# Patient Record
Sex: Female | Born: 1970 | Hispanic: Yes
Health system: Midwestern US, Community
[De-identification: ages and names within clinical notes are randomized; demographics above are authoritative.]

## PROBLEM LIST (undated history)

## (undated) DIAGNOSIS — I10 Essential (primary) hypertension: Secondary | ICD-10-CM

## (undated) DIAGNOSIS — E785 Hyperlipidemia, unspecified: Secondary | ICD-10-CM

## (undated) HISTORY — PX: CHOLECYSTECTOMY: SHX55

## (undated) HISTORY — PX: TUBAL LIGATION: SHX77

## (undated) HISTORY — DX: Hyperlipidemia, unspecified: E78.5

## (undated) HISTORY — DX: Essential (primary) hypertension: I10

## (undated) HISTORY — PX: APPENDECTOMY: SHX54

---

## 2017-08-30 ENCOUNTER — Inpatient Hospital Stay
Admit: 2017-08-30 | Discharge: 2017-08-30 | Disposition: A | Discharge status: Home / Self Care | Payer: MEDICAID | Attending: Emergency Medicine

## 2017-08-30 DIAGNOSIS — R112 Nausea with vomiting, unspecified: Secondary | ICD-10-CM

## 2017-08-30 LAB — EKG 12-LEAD
Atrial Rate: 66 {beats}/min
P Axis: 68 degrees
P-R Interval: 132 ms
Q-T Interval: 400 ms
QRS Duration: 88 ms
QTc Calculation (Bazett): 419 ms
R Axis: 19 degrees
T Axis: 20 degrees
Ventricular Rate: 66 {beats}/min

## 2017-08-30 LAB — URINE WITH REFLEXED MICRO
CASTS 2: NONE SEEN /lpf
Casts UA: NONE SEEN /lpf
Crystals, UA: NONE SEEN
Glucose, Ur: NEGATIVE mg/dl
Leukocyte Esterase, Urine: NEGATIVE
MISCELLANEOUS 2: NONE SEEN
Nitrite, Urine: NEGATIVE
Protein, UA: NEGATIVE
Renal Epithelial, UA: NONE SEEN
Specific Gravity, Urine: 1.019 (ref 1.002–1.03)
Urobilinogen, Urine: 1 eu/dl (ref 0.0–1.0)
Yeast, UA: NONE SEEN
pH, UA: 5.5 (ref 5.0–9.0)

## 2017-08-30 LAB — OSMOLALITY: Osmolality Calc: 274.6 mOsmol/kg — ABNORMAL LOW (ref >=275.0)

## 2017-08-30 LAB — BASIC METABOLIC PANEL
BUN: 10 mg/dL (ref 7–22)
CO2: 26 meq/L (ref 23–33)
Calcium: 10 mg/dL (ref 8.5–10.5)
Chloride: 97 meq/L — ABNORMAL LOW (ref 98–111)
Creatinine: 0.6 mg/dL (ref 0.4–1.2)
Glucose: 97 mg/dL (ref 70–108)
Potassium: 4 meq/L (ref 3.5–5.2)
Sodium: 138 meq/L (ref 135–145)

## 2017-08-30 LAB — HCG, SERUM, QUALITATIVE: Preg, Serum: NEGATIVE

## 2017-08-30 LAB — BILE ACIDS, TOTAL: Ictotest: NEGATIVE

## 2017-08-30 LAB — ANION GAP: Anion Gap: 15 meq/L (ref 8.0–16.0)

## 2017-08-30 LAB — GLOMERULAR FILTRATION RATE, ESTIMATED: Est, Glom Filt Rate: >90 mL/min/{1.73_m2}

## 2017-08-30 MED ORDER — ONDANSETRON HCL 4 MG/2ML IJ SOLN
4 MG/2ML | Freq: Once | INTRAMUSCULAR | Status: AC
Start: 2017-08-30 — End: 2017-08-30
  Administered 2017-08-30: 15:00:00 4 mg via INTRAVENOUS

## 2017-08-30 MED ORDER — SODIUM CHLORIDE 0.9 % IV BOLUS
0.9 % | Freq: Once | INTRAVENOUS | Status: AC
Start: 2017-08-30 — End: 2017-08-30
  Administered 2017-08-30: 15:00:00 1000 mL via INTRAVENOUS

## 2017-08-30 MED FILL — ONDANSETRON HCL 4 MG/2ML IJ SOLN: 4 MG/2ML | INTRAMUSCULAR | Qty: 2

## 2017-08-30 NOTE — ED Triage Notes (Signed)
Patient presents to ED with c/o vomiting that started yesterday around 0500. Patient states that she hasn't been able to keep any fluids down. States that before when this has happened it was due to her blood pressure being elevated. Denies any chest pain or headache and states that her last dose of lisinopril was yesterday morning. Call light in reach.

## 2017-08-30 NOTE — ED Provider Notes (Signed)
[facility]  eMERGENCY dEPARTMENT eNCOUnter      Pt Name: Melissa Bender  MRN: [id number]  Birthdate 1970-11-26  Date of evaluation: 08/30/2017  Provider: Neva SeatALEXANDER A Asherah Lavoy, DO    CHIEF COMPLAINT       Chief Complaint   Patient presents with   . Emesis   . Hypertension       HISTORY OF PRESENT ILLNESS    Melissa Icelizabath Picotte is a 46 y.o. female who presents to the emergency department from home With complaints of multiple episodes of vomiting since 5:00 yesterday morning.  Patient states she's had approximately 20 episodes of vomiting but no diarrhea, no fever, no chills, no abdominal pain.  She has had a very mild headache.  Denies any dysuria or hematuria.  Denies any other associated symptoms.  She called a family doctor today but was told to come to the emergency department to get "checked out".  Negative recent travel history and no infectious contacts      Triage notes and Nursing notes were reviewed by myself.  Any discrepancies are addressed above.    PAST MEDICAL HISTORY     Past Medical History:   Diagnosis Date   . Hypertension        SURGICAL HISTORY       Past Surgical History:   Procedure Laterality Date   . CHOLECYSTECTOMY     . TUBAL LIGATION         CURRENT MEDICATIONS       Discharge Medication List as of 08/30/2017 11:21 AM      CONTINUE these medications which have NOT CHANGED    Details   lisinopril (PRINIVIL;ZESTRIL) 10 MG tablet Take 10 mg by mouth dailyHistorical Med             ALLERGIES     Patient has no known allergies.    FAMILY HISTORY     History reviewed. No pertinent family history.     SOCIAL HISTORY       Social History     Social History   . Marital status: Divorced     Spouse name: N/A   . Number of children: N/A   . Years of education: N/A     Social History Main Topics   . Smoking status: Never Smoker   . Smokeless tobacco: None   . Alcohol use No   . Drug use: Unknown   . Sexual activity: Not Asked     Other Topics Concern   . None     Social History Narrative    . None       REVIEW OF SYSTEMS     Review of Systems   Constitutional: Negative for chills, diaphoresis and fever.   HENT: Negative for facial swelling, sore throat, trouble swallowing and voice change.    Eyes: Negative for photophobia and visual disturbance.   Respiratory: Negative for cough, chest tightness, shortness of breath and wheezing.    Cardiovascular: Negative for chest pain, palpitations and leg swelling.   Gastrointestinal: Positive for nausea and vomiting. Negative for abdominal pain, blood in stool and diarrhea.   Endocrine: Negative for polydipsia and polyuria.   Genitourinary: Negative for dysuria, frequency, hematuria and urgency.   Musculoskeletal: Negative for back pain, neck pain and neck stiffness.   Skin: Negative for pallor and rash.   Neurological: Negative for seizures, speech difficulty, weakness, numbness and headaches.   Hematological: Does not bruise/bleed easily.   Psychiatric/Behavioral: Negative for confusion. The patient  is not nervous/anxious.        Except as noted above the remainder of the review of systems was reviewed and isnegative.   PHYSICAL EXAM    (up to 7 for level 4, 8 or more for level 5)     ED Triage Vitals [08/30/17 0948]   BP Temp Temp Source Pulse Resp SpO2 Height Weight   111/64 98.8 F (37.1 C) Oral 66 10 100 % 4\' 11"  (1.499 m) 200 lb (90.7 kg)       Physical Exam   Constitutional: She is oriented to person, place, and time. Vital signs are normal. She appears well-developed and well-nourished.  Non-toxic appearance. She does not appear ill. No distress.   HENT:   Head: Normocephalic and atraumatic.   Mouth/Throat: Oropharynx is clear and moist.   Eyes: Pupils are equal, round, and reactive to light. Conjunctivae and EOM are normal. Right conjunctiva is not injected. Left conjunctiva is not injected. No scleral icterus.   Neck: Normal range of motion. Neck supple. No tracheal deviation present. No thyromegaly present.   Cardiovascular: Normal rate, regular  rhythm, normal heart sounds and intact distal pulses.  Exam reveals no gallop and no friction rub.    No murmur heard.  Pulmonary/Chest: Effort normal and breath sounds normal. No stridor. No respiratory distress. She has no wheezes. She has no rales.   Abdominal: Soft. Bowel sounds are normal. She exhibits no distension and no mass. There is no tenderness. There is no rebound and no guarding.   Negative Murphy's sign  Nontender McBurney's Point  Negative Rovsig's sign  No bruising or echymosis of abdomen   Musculoskeletal: She exhibits no edema or tenderness.   Negative Homan's Sign bilaterally   Lymphadenopathy:     She has no cervical adenopathy.   Neurological: She is alert and oriented to person, place, and time. No cranial nerve deficit. She exhibits normal muscle tone. Coordination normal.   No nystagmus   Skin: Skin is warm and dry. No rash noted. She is not diaphoretic. No erythema. No pallor.   Psychiatric: She has a normal mood and affect. Her behavior is normal. Thought content normal.   Nursing note and vitals reviewed.      DIAGNOSTIC RESULTS     EKG:(none if blank)  All EKG's are interpreted by theEmergency Department Physician who either signs or Co-signs this chart in the absence of a cardiologist.    EKG done per nursing protocol is reviewed by myself.  Shows a sinus rhythm with a sinus arrhythmia, normal PR, QRS, and QTc intervals.  No ectopy.  There is isolated T-wave inversion in lead 3.    RADIOLOGY: (none if blank)   Interpretation per the Radiologistbelow, if available at the time of this note:    @WETREAD @    LABS:  @EDLABS @    All other labs were within normal range or not returned as of this dictation.    EMERGENCY DEPARTMENT COURSE andMedical Decision Making:     MDM/   The patient presents with vomiting.  The patient is feeling better with a benign repeat examination. There is no evidence of an acute abdomen at this time.  Based on history, physical exam, risk factors, and tests,  my  suspicion for bowel obstruction, acute pancreatitis, abscess, perforated viscous, diverticulitis, cholecystis, appendicitis is very low and I feel the patient can be managed as an outpatient with follow up.  I also see no evidence of aortic dissection, AAA, or Acute Coronary  Syndrome.  Instructions have been given for the patient to return to the ED for worsening of the pain, high fevers, intractable vomiting, or bleeding.  Strict follow up and return precautions have been discussed.    Strict returnprecautions and follow up instructions were discussed with the patient with which the patient agrees    ED Medications administered this visit:    Medications   ondansetron (ZOFRAN) injection 4 mg (4 mg Intravenous Given 08/30/17 1008)   0.9 % sodium chloride bolus (0 mLs Intravenous Stopped 08/30/17 1147)       CONSULTS: (None if blank)  None    Procedures: (None if blank)       CLINICAL IMPRESSION      1. Intractable vomiting with nausea, unspecified vomiting type          DISPOSITION/PLAN   DISPOSITION Decision To Discharge 08/30/2017 11:47:58 AM      PATIENT REFERRED TO:  [facility]  [address] [place] [address]  [phone]  Go in 2 days        DISCHARGE MEDICATIONS:  Discharge Medication List as of 08/30/2017 11:21 AM                 (Please note that portions of this note were completed with a voice recognition program.  Efforts were made to edit the dictations but occasionallywords are mis-transcribed.)      Neva Seat, DO (electronically signed)  Attending Emergency Department Provider          Neva Seat, DO  09/03/17 [phone]

## 2017-08-30 NOTE — Discharge Instructions (Signed)
Return to the Emergency Department immediately if you develop any abdominal pain, fever, persistent vomiting, chest pain, lightheadedness, dizziness , or you have any other concerns.  Please follow up with your primary care doctor in 1-2 days.

## 2017-08-30 NOTE — ED Notes (Signed)
Patient resting in bed drinking gatorade. Denies any nausea at this time. Updated on plan of care. IV fluids still infusing. Denies any other needs. Call light in reach.      Wynelle LinkKristen Alantis Bethune, RN  08/30/17 1116

## 2020-01-22 LAB — HEMOGLOBIN A1C: Hemoglobin A1C: 5.3

## 2020-01-22 LAB — LIPID PANEL
Cholesterol: 172 (ref 0–200)
HDL: 41 (ref 35–70)
LDL Cholesterol: 103
LDl/HDL Ratio: 28
Triglycerides: 161 — AB (ref 40–160)

## 2020-01-22 LAB — RESULTS CONSOLE HPV: CHL HPV: POSITIVE

## 2020-03-10 HISTORY — PX: LEEP: SHX91

## 2020-09-27 ENCOUNTER — Emergency Department (HOSPITAL_COMMUNITY)
Admission: EM | Admit: 2020-09-27 | Discharge: 2020-09-28 | Disposition: A | Discharge status: Home / Self Care | Payer: Self-pay | Attending: Emergency Medicine | Admitting: Emergency Medicine

## 2020-09-27 DIAGNOSIS — I10 Essential (primary) hypertension: Secondary | ICD-10-CM | POA: Insufficient documentation

## 2020-09-27 DIAGNOSIS — N390 Urinary tract infection, site not specified: Secondary | ICD-10-CM | POA: Insufficient documentation

## 2020-09-27 LAB — URINALYSIS, ROUTINE W REFLEX MICROSCOPIC
Bilirubin Urine: NEGATIVE
Glucose, UA: NEGATIVE mg/dL
Hgb urine dipstick: NEGATIVE
Ketones, ur: NEGATIVE mg/dL
Nitrite: NEGATIVE
Protein, ur: NEGATIVE mg/dL
Specific Gravity, Urine: 1.015 (ref 1.005–1.030)
pH: 5 (ref 5.0–8.0)

## 2020-09-27 LAB — COMPREHENSIVE METABOLIC PANEL
ALT: 24 U/L (ref 0–44)
AST: 22 U/L (ref 15–41)
Albumin: 3.9 g/dL (ref 3.5–5.0)
Alkaline Phosphatase: 77 U/L (ref 38–126)
Anion gap: 11 (ref 5–15)
BUN: 9 mg/dL (ref 6–20)
CO2: 26 mmol/L (ref 22–32)
Calcium: 9.7 mg/dL (ref 8.9–10.3)
Chloride: 100 mmol/L (ref 98–111)
Creatinine, Ser: 0.81 mg/dL (ref 0.44–1.00)
GFR, Estimated: >60 mL/min (ref >60)
Glucose, Bld: 95 mg/dL (ref 70–99)
Potassium: 4.8 mmol/L (ref 3.5–5.1)
Sodium: 137 mmol/L (ref 135–145)
Total Bilirubin: 0.9 mg/dL (ref 0.3–1.2)
Total Protein: 8.3 g/dL — ABNORMAL HIGH (ref 6.5–8.1)

## 2020-09-27 LAB — CBC
HCT: 46.8 % — ABNORMAL HIGH (ref 36.0–46.0)
Hemoglobin: 14.9 g/dL (ref 12.0–15.0)
MCH: 29.2 pg (ref 26.0–34.0)
MCHC: 31.8 g/dL (ref 30.0–36.0)
MCV: 91.8 fL (ref 80.0–100.0)
Platelets: 343 10*3/uL (ref 150–400)
RBC: 5.1 MIL/uL (ref 3.87–5.11)
RDW: 13.5 % (ref 11.5–15.5)
WBC: 9.6 10*3/uL (ref 4.0–10.5)
nRBC: 0 % (ref 0.0–0.2)

## 2020-09-27 NOTE — ED Triage Notes (Signed)
Pt here from home with c/o h/a and htn along with UTI type symptoms , pt is on lisinopril for 10 yrs but has stopped working out , no MD here moved from [employer]

## 2020-09-28 MED ORDER — PHENAZOPYRIDINE HCL 200 MG PO TABS: 200.0000 mg | ORAL_TABLET | Freq: Three times a day (TID) | ORAL | 0 refills | Status: DC | PRN

## 2020-09-28 MED ORDER — CEPHALEXIN 250 MG PO CAPS
500.0000 mg | ORAL_CAPSULE | Freq: Once | ORAL | Status: AC
  Administered 2020-09-28: 500 mg via ORAL
  Filled 2020-09-28: qty 2

## 2020-09-28 MED ORDER — CEPHALEXIN 500 MG PO CAPS: 500.0000 mg | ORAL_CAPSULE | Freq: Three times a day (TID) | ORAL | 0 refills | Status: DC

## 2020-09-28 MED ORDER — PHENAZOPYRIDINE HCL 100 MG PO TABS
95.0000 mg | ORAL_TABLET | Freq: Once | ORAL | Status: AC
  Administered 2020-09-28: 100 mg via ORAL
  Filled 2020-09-28: qty 1

## 2020-09-28 NOTE — ED Provider Notes (Signed)
[facility] EMERGENCY DEPARTMENT Provider Note   CSN: [id number] Arrival date & time: 09/27/20  1305     History Chief Complaint  Patient presents with  . Dysuria  . Hypertension    Christine Watson is a 50 y.o. female.  The history is provided by the patient and medical records.  Dysuria Hypertension    50 year old female presenting to the ED with 2 complaints.  1.  Dysuria-- states this has been intermittent for about [redacted] weeks along with frequency.  No fever, chills, sweats, or flank pain.  States she has been taking AZO with some relief.  No pelvic pain, vaginal discharge, irregular bleeding.  Had PAP smear in [place] just before she moved but was abnormal so needs GYN follow-up.  2.  Elevated BP-- hx of HTN, has been on lisinopril for 10+ years.  States has been compliant with all medications.  Over the past few days did have some headaches and elevated BP readings at home.  States today she has felt ok.  Unsure if this was related to AZO to stopped this yesterday.  No current chest pain, SOB, headache, dizziness, confusion, numbness, weakness.  Recently moved to [facility] from [place] so does not have a PCP in the area yet.  No past medical history on file.  There are no problems to display for this patient.    OB History   No obstetric history on file.     No family history on file.     Home Medications Prior to Admission medications   Not on File    Allergies    Patient has no allergy information on record.  Review of Systems   Review of Systems  Genitourinary: Positive for dysuria and frequency.  All other systems reviewed and are negative.   Physical Exam Updated Vital Signs BP 122/72 (BP Location: Left Arm)   Pulse (!) 58   Temp 98.5 F (36.9 C) (Oral)   Resp 18   Ht 4\' 11"  (1.499 m)   Wt 86.2 kg   SpO2 99%   BMI 38.38 kg/m   Physical Exam Vitals and nursing note reviewed.  Constitutional:      Appearance: She is well-developed  and well-nourished.  HENT:     Head: Normocephalic and atraumatic.     Mouth/Throat:     Mouth: Oropharynx is clear and moist.  Eyes:     Extraocular Movements: EOM normal.     Conjunctiva/sclera: Conjunctivae normal.     Pupils: Pupils are equal, round, and reactive to light.  Cardiovascular:     Rate and Rhythm: Normal rate and regular rhythm.     Heart sounds: Normal heart sounds.  Pulmonary:     Effort: Pulmonary effort is normal. No respiratory distress.     Breath sounds: Normal breath sounds. No rhonchi.  Abdominal:     General: Bowel sounds are normal.     Palpations: Abdomen is soft.     Tenderness: There is no abdominal tenderness. There is no rebound.  Musculoskeletal:        General: Normal range of motion.     Cervical back: Normal range of motion.  Skin:    General: Skin is warm and dry.  Neurological:     Mental Status: She is alert and oriented to person, place, and time.  Psychiatric:        Mood and Affect: Mood and affect normal.     ED Results / Procedures / Treatments   Labs (  all labs ordered are listed, but only abnormal results are displayed) Labs Reviewed  URINALYSIS, ROUTINE W REFLEX MICROSCOPIC - Abnormal; Notable for the following components:      Result Value   APPearance HAZY (*)    Leukocytes,Ua TRACE (*)    Bacteria, UA MANY (*)    All other components within normal limits  CBC - Abnormal; Notable for the following components:   HCT 46.8 (*)    All other components within normal limits  COMPREHENSIVE METABOLIC PANEL - Abnormal; Notable for the following components:   Total Protein 8.3 (*)    All other components within normal limits    EKG None  Radiology No results found.  Procedures Procedures (including critical care time)  Medications Ordered in ED Medications  cephALEXin (KEFLEX) capsule 500 mg (500 mg Oral Given 09/28/20 0044)  phenazopyridine (PYRIDIUM) tablet 100 mg (100 mg Oral Given 09/28/20 0044)    ED Course  I  have reviewed the triage vital signs and the nursing notes.  Pertinent labs & imaging results that were available during my care of the patient were reviewed by me and considered in my medical decision making (see chart for details).    MDM Rules/Calculators/A&P  50 year old female here with 2 complaints.  1. Dysuria--intermittent for about 3 weeks with urinary frequency.  No flank pain or fever.  No pelvic pain or vaginal discharge.  Afebrile, nontoxic.  No CVA tenderness.  UA does appear infectious.  Will start on course of Keflex and pyridium.  2. elevated blood pressure--history of same, on lisinopril for 10+ years.  Had some elevated readings over the past few days but was taking over-the-counter AZO.  This may have precipitated elevated readings.  Blood pressure here has been stable and she is currently asymptomatic.  Labs are reassuring.  No evidence of endorgan damage.  We will have her continue to monitor at home.  She has recently relocated from [place] and does not have PCP or GYN follow-up.  She was given information for wellness clinic, [facility], and patient care center to help facilitate follow-up.  I have also messaged her on call CNM to help with this as well.  Final Clinical Impression(s) / ED Diagnoses Final diagnoses:  Lower urinary tract infectious disease  Primary hypertension    Rx / DC Orders ED Discharge Orders         Ordered    cephALEXin (KEFLEX) 500 MG capsule  3 times daily        09/28/20 0054    phenazopyridine (PYRIDIUM) 200 MG tablet  3 times daily PRN        09/28/20 0054           Garlon Hatchet, PA-C 09/28/20 0101    Maia Plan, MD 09/28/20 0403

## 2020-09-28 NOTE — Discharge Instructions (Addendum)
Blood pressure has regulated here  AZO may have been causing increased readings.  Keep an eye on this at home. Follow-up with primary care.  I have messaged our case manager to see if we can help get you set up at one of our clinics.  I have also attached their information along with local GYN practice. Take the prescribed medication as directed.  Make to finish ALL the antibiotic.  Sent to [facility] for you. Return to the ED for new or worsening symptoms.

## 2020-10-11 ENCOUNTER — Encounter: Payer: Self-pay | Admitting: Family

## 2020-10-11 ENCOUNTER — Other Ambulatory Visit: Payer: Self-pay | Admitting: General Practice

## 2020-10-11 ENCOUNTER — Other Ambulatory Visit: Payer: Self-pay

## 2020-10-11 ENCOUNTER — Ambulatory Visit (INDEPENDENT_AMBULATORY_CARE_PROVIDER_SITE_OTHER): Payer: Self-pay | Admitting: Family

## 2020-10-11 VITALS — BP 127/81 | HR 59 | Ht 58.19 in | Wt 194.2 lb

## 2020-10-11 DIAGNOSIS — Z7689 Persons encountering health services in other specified circumstances: Secondary | ICD-10-CM

## 2020-10-11 DIAGNOSIS — I10 Essential (primary) hypertension: Secondary | ICD-10-CM

## 2020-10-11 MED ORDER — LISINOPRIL 10 MG PO TABS
10.0000 mg | ORAL_TABLET | Freq: Every day | ORAL | 0 refills | Status: DC
Start: 2020-10-11 — End: 2022-10-09

## 2020-10-11 NOTE — Progress Notes (Signed)
Establish care BP concerns  Gyn concerns but already has visit for 2/23

## 2020-10-11 NOTE — Patient Instructions (Signed)
Return in 4 to 6 weeks or sooner if needed for annual physical examination, labs, and health maintenance. Arrive fasting meaning having had no food and/or nothing to drink for at least 8 hours prior to appointment.  Continue Lisinopril for high blood pressure.  Thank you for choosing Primary Care at [facility] for your medical home!    Christine Watson was seen by Rema Fendt, NP today.   Christine Watson's primary care provider is Rema Fendt, NP.   For the best care possible,  you should try to see Ricky Stabs, NP whenever you come to clinic.   We look forward to seeing you again soon!  If you have any questions about your visit today,  please call us at [phone]  Or feel free to reach your provider via MyChart.    Hypertension, Adult Hypertension is another name for high blood pressure. High blood pressure forces your heart to work harder to pump blood. This can cause problems over time. There are two numbers in a blood pressure reading. There is a top number (systolic) over a bottom number (diastolic). It is best to have a blood pressure that is below 120/80. Healthy choices can help lower your blood pressure, or you may need medicine to help lower it. What are the causes? The cause of this condition is not known. Some conditions may be related to high blood pressure. What increases the risk?  Smoking.  Having type 2 diabetes mellitus, high cholesterol, or both.  Not getting enough exercise or physical activity.  Being overweight.  Having too much fat, sugar, calories, or salt (sodium) in your diet.  Drinking too much alcohol.  Having long-term (chronic) kidney disease.  Having a family history of high blood pressure.  Age. Risk increases with age.  Race. You may be at higher risk if you are African American.  Gender. Men are at higher risk than women before age 32. After age 27, women are at higher risk than men.  Having obstructive sleep  apnea.  Stress. What are the signs or symptoms?  High blood pressure may not cause symptoms. Very high blood pressure (hypertensive crisis) may cause: ? Headache. ? Feelings of worry or nervousness (anxiety). ? Shortness of breath. ? Nosebleed. ? A feeling of being sick to your stomach (nausea). ? Throwing up (vomiting). ? Changes in how you see. ? Very bad chest pain. ? Seizures. How is this treated?  This condition is treated by making healthy lifestyle changes, such as: ? Eating healthy foods. ? Exercising more. ? Drinking less alcohol.  Your health care provider may prescribe medicine if lifestyle changes are not enough to get your blood pressure under control, and if: ? Your top number is above 130. ? Your bottom number is above 80.  Your personal target blood pressure may vary. Follow these instructions at home: Eating and drinking  If told, follow the DASH eating plan. To follow this plan: ? Fill one half of your plate at each meal with fruits and vegetables. ? Fill one fourth of your plate at each meal with whole grains. Whole grains include whole-wheat pasta, brown rice, and whole-grain bread. ? Eat or drink low-fat dairy products, such as skim milk or low-fat yogurt. ? Fill one fourth of your plate at each meal with low-fat (lean) proteins. Low-fat proteins include fish, chicken without skin, eggs, beans, and tofu. ? Avoid fatty meat, cured and processed meat, or chicken with skin. ? Avoid pre-made or processed  food.  Eat less than 1,500 mg of salt each day.  Do not drink alcohol if: ? Your doctor tells you not to drink. ? You are pregnant, may be pregnant, or are planning to become pregnant.  If you drink alcohol: ? Limit how much you use to:  0-1 drink a day for women.  0-2 drinks a day for men. ? Be aware of how much alcohol is in your drink. In the [place] one drink equals one 12 oz bottle of beer (355 mL), one 5 oz glass of wine (148 mL), or one 1 oz  glass of hard liquor (44 mL).   Lifestyle  Work with your doctor to stay at a healthy weight or to lose weight. Ask your doctor what the best weight is for you.  Get at least 30 minutes of exercise most days of the week. This may include walking, swimming, or biking.  Get at least 30 minutes of exercise that strengthens your muscles (resistance exercise) at least 3 days a week. This may include lifting weights or doing Pilates.  Do not use any products that contain nicotine or tobacco, such as cigarettes, e-cigarettes, and chewing tobacco. If you need help quitting, ask your doctor.  Check your blood pressure at home as told by your doctor.  Keep all follow-up visits as told by your doctor. This is important.   Medicines  Take over-the-counter and prescription medicines only as told by your doctor. Follow directions carefully.  Do not skip doses of blood pressure medicine. The medicine does not work as well if you skip doses. Skipping doses also puts you at risk for problems.  Ask your doctor about side effects or reactions to medicines that you should watch for. Contact a doctor if you:  Think you are having a reaction to the medicine you are taking.  Have headaches that keep coming back (recurring).  Feel dizzy.  Have swelling in your ankles.  Have trouble with your vision. Get help right away if you:  Get a very bad headache.  Start to feel mixed up (confused).  Feel weak or numb.  Feel faint.  Have very bad pain in your: ? Chest. ? Belly (abdomen).  Throw up more than once.  Have trouble breathing. Summary  Hypertension is another name for high blood pressure.  High blood pressure forces your heart to work harder to pump blood.  For most people, a normal blood pressure is less than 120/80.  Making healthy choices can help lower blood pressure. If your blood pressure does not get lower with healthy choices, you may need to take medicine. This information  is not intended to replace advice given to you by your health care provider. Make sure you discuss any questions you have with your health care provider. Document Revised: 05/07/2018 Document Reviewed: 05/07/2018 Elsevier Patient Education  2021 [employer].

## 2020-10-11 NOTE — Progress Notes (Signed)
Subjective:    Christine Watson - 50 y.o. female MRN [id number]  Date of birth: 1971/01/18  HPI  Christine Watson is to establish care. Patient has no significant PMH.   Current issues and/or concerns: 1. HYPERTENSION FOLLOW-UP: Currently taking: see medication list Med Adherence: [x]  Yes    []  No Medication side effects: []  Yes    [x]  No Adherence with salt restriction (low-salt diet): [x]  Yes    []  No Exercise: Yes [x]  No []  Home Monitoring?: [x]  Yes    []  No Monitoring Frequency: []  Yes    [x]  No, reports usually checking blood pressure only when she has a headache, results typically 190's/100's during those times.   Home BP results range: []  Yes    [x]  No Smoking []  Yes [x]  No SOB? []  Yes    [x]  No Chest Pain?: []  Yes    [x]  No Leg swelling?: []  Yes    [x]  No Headaches?: []  Yes    [x]  No Dizziness? []  Yes    [x]  No   ROS per HPI    Health Maintenance:  Health Maintenance Due  Topic Date Due  . Hepatitis C Screening  Never done  . COVID-19 Vaccine (1) Never done  . HIV Screening  Never done  . TETANUS/TDAP  Never done  . COLONOSCOPY (Pts 45-41yrs Insurance coverage will need to be confirmed)  Never done     Past Medical History: There are no problems to display for this patient.   Social History   reports that she has never smoked. She has never used smokeless tobacco. She reports previous alcohol use. She reports previous drug use.   Family History  family history includes Hypertension in her mother.   Medications: reviewed and updated   Objective:   Physical Exam BP 127/81 (BP Location: Left Arm, Patient Position: Sitting)   Pulse (!) 59   Ht 4' 10.19" (1.478 m)   Wt 194 lb 3.2 oz (88.1 kg)   SpO2 96%   BMI 40.32 kg/m    Wt Readings from Last 3 Encounters:  10/11/20 194 lb 3.2 oz (88.1 kg)  09/27/20 190 lb (86.2 kg)   Physical Exam Constitutional:      Appearance: She is obese.  HENT:     Head: Normocephalic.  Eyes:     Extraocular  Movements: Extraocular movements intact.     Pupils: Pupils are equal, round, and reactive to light.  Cardiovascular:     Rate and Rhythm: Bradycardia present.     Pulses: Normal pulses.     Heart sounds: Normal heart sounds.  Pulmonary:     Effort: Pulmonary effort is normal.     Breath sounds: Normal breath sounds.  Musculoskeletal:     Cervical back: Normal range of motion and neck supple.  Neurological:     General: No focal deficit present.     Mental Status: She is alert.  Psychiatric:        Mood and Affect: Mood normal.        Behavior: Behavior normal.      Assessment & Plan:  1. Encounter to establish care: - Patient presents today to establish care.  - Return in 4 to 6 weeks or sooner if needed for annual physical examination, labs, and health maintenance. Arrive fasting meaning having had no food and/or nothing to drink for at least 8 hours prior to appointment.  2. Essential hypertension: - Blood pressure at goal today.  - Continue Lisinopril as prescribed.  -  Counseled on blood pressure goal of less than 130/80, low-sodium, DASH diet, medication compliance, 150 minutes of moderate intensity exercise per week as tolerated. Discussed medication compliance, adverse effects. - Follow-up with primary Aniah Pauli in 3 months or sooner if needed.  - lisinopril (ZESTRIL) 10 MG tablet; Take 1 tablet (10 mg total) by mouth daily.  Dispense: 90 tablet; Refill: 0   Ricky Stabs, NP 10/11/2020, 1:22 PM Primary Care at [facility]

## 2020-11-02 ENCOUNTER — Encounter: Payer: Self-pay | Admitting: Obstetrics and Gynecology

## 2020-11-17 ENCOUNTER — Other Ambulatory Visit: Payer: Self-pay

## 2020-11-17 ENCOUNTER — Encounter: Payer: Self-pay | Admitting: Obstetrics and Gynecology

## 2020-11-17 ENCOUNTER — Ambulatory Visit (INDEPENDENT_AMBULATORY_CARE_PROVIDER_SITE_OTHER): Payer: Self-pay | Admitting: Obstetrics and Gynecology

## 2020-11-17 VITALS — BP 122/59 | HR 48 | Ht 59.0 in | Wt 196.1 lb

## 2020-11-17 DIAGNOSIS — N879 Dysplasia of cervix uteri, unspecified: Secondary | ICD-10-CM

## 2020-11-17 NOTE — Progress Notes (Addendum)
Obstetrics and Gynecology New Patient Evaluation  Appointment Date: 11/17/2020  OBGYN Clinic: [facility]  Primary Care Provider: Rema Watson  Referring Provider: Rema Fendt, NP  Chief Complaint: Cervical dysplasia   History of Present Illness: Christine Watson is a 50 y.o. Hispanic G3P0 (Patient's last menstrual period was 11/10/2020 (approximate).), seen for the above chief complaint. Her past medical history is significant for HTN, hyperlipidemia  Patient here for consult for cervical dysplasia. Last child born 23 years ago and all her paps before then were normal but she hadn't had one since then. 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 No ecc done  She states the doctor in [place] recommended 6 month repeat exam.   Review of Systems: Pertinent items noted in HPI and remainder of comprehensive ROS otherwise negative.   Past Medical History:  Past Medical History:  Diagnosis Date   Hyperlipidemia    Phreesia 10/08/2020   Hypertension    Phreesia 10/08/2020    Past Surgical History:  Past Surgical History:  Procedure Laterality Date   APPENDECTOMY N/A    Phreesia 10/08/2020   CHOLECYSTECTOMY N/A    Phreesia 10/08/2020   LEEP  03/2020   TUBAL LIGATION N/A    Phreesia 10/08/2020    Past Obstetrical History:   OB History  Gravida Para Term Preterm AB Living  3         3  SAB IAB Ectopic Multiple Live Births          3    # Outcome Date GA Lbr Len/2nd Weight Sex Delivery Anes PTL Lv  3 Gravida           2 Gravida           1 Gravida             Obstetric Comments  SVD x 3. Last delivery 1999   Past Gynecological History: As per HPI.  Social History:  Social History   Socioeconomic History   Marital status: Divorced    Spouse name: Not on file   Number of children: Not on file   Years of education: Not on file   Highest education level: Not on file  Occupational History   Not on file  Tobacco Use   Smoking status: Never Smoker   Smokeless tobacco: Never Used  Vaping Use   Vaping Use: Never used  Substance and Sexual Activity   Alcohol use: Not Currently   Drug use: Not Currently   Sexual activity: Yes    Comment: tubal ligation  Other Topics Concern   Not on file  Social History Narrative   Not on file   Social Determinants of Health   Financial Resource Strain: Not on file  Food Insecurity: Not on file  Transportation Needs: Not on file  Physical Activity: Not on file  Stress: Not on file  Social Connections: Not on  file  Intimate Partner Violence: Not on file    Family History:  Family History  Problem Relation Age of Onset   Hypertension Mother     Medications Christine Watson had no medications administered during this visit. Current Outpatient Medications  Medication Sig Dispense Refill   Ascorbic Acid (VITAMIN C) 1000 MG tablet Take 1,000 mg by mouth daily.     Calcium 500-125 MG-UNIT TABS Take 1 tablet by mouth.     lisinopril (ZESTRIL) 10 MG tablet Take 1 tablet (10 mg total) by mouth daily. 90 tablet 0   Omega-3 Fatty Acids (FISH OIL) 1000 MG CAPS Take 1 capsule by mouth.     No current facility-administered medications for this visit.    Allergies Patient has no known allergies.   Physical Exam:  BP (!) 122/59     Pulse (!) 48    Ht 4\' 11"  (1.499 m)    Wt 196 lb 1.6 oz (89 kg)    LMP 11/10/2020 (Approximate)    BMI 39.61 kg/m  Body mass index is 39.61 kg/m.  General appearance: Well nourished, well developed female in no acute distress.  Neuro/Psych:  Normal mood and affect.  Skin:  Warm and dry.   Laboratory: none  Radiology: none  Assessment: pt stable  Plan:  1. Cervical dysplasia I told her I agree with the prior recommendation for six month follow up and recommend doing an colposcopy, biopsy and ECC.  Patient is self pay, so we gave her resources for [facility]CP. They may start with a pap smear first before referring back to 01/10/2021.   I also told her that we weill try and obtain the colposcopy report because if there was a visible, focal spot that was biopsied that came back as cin 2 and the provider felt that it was all removed with the biopsy, it will make me feel better about the LEEP only having cin 1  Patient told if having any issues with being seen sometime in the next month to let me know by calling or MyChart.   RTC PRN  [place] MD Attending [facility] [employer])

## 2020-11-17 NOTE — Progress Notes (Signed)
Not having any pain today.

## 2020-11-23 ENCOUNTER — Encounter: Payer: Self-pay | Admitting: Family

## 2020-11-23 NOTE — Progress Notes (Signed)
Patient was late for appointment and rescheduled.

## 2020-12-09 ENCOUNTER — Emergency Department (HOSPITAL_COMMUNITY): Payer: Self-pay

## 2020-12-09 ENCOUNTER — Emergency Department (HOSPITAL_COMMUNITY)
Admission: EM | Admit: 2020-12-09 | Discharge: 2020-12-09 | Disposition: A | Discharge status: Home / Self Care | Payer: Self-pay | Attending: Emergency Medicine | Admitting: Emergency Medicine

## 2020-12-09 ENCOUNTER — Encounter (HOSPITAL_COMMUNITY): Payer: Self-pay

## 2020-12-09 ENCOUNTER — Other Ambulatory Visit: Payer: Self-pay

## 2020-12-09 DIAGNOSIS — M549 Dorsalgia, unspecified: Secondary | ICD-10-CM

## 2020-12-09 DIAGNOSIS — I10 Essential (primary) hypertension: Secondary | ICD-10-CM | POA: Insufficient documentation

## 2020-12-09 DIAGNOSIS — R6883 Chills (without fever): Secondary | ICD-10-CM | POA: Insufficient documentation

## 2020-12-09 DIAGNOSIS — M546 Pain in thoracic spine: Secondary | ICD-10-CM | POA: Insufficient documentation

## 2020-12-09 DIAGNOSIS — Z79899 Other long term (current) drug therapy: Secondary | ICD-10-CM | POA: Insufficient documentation

## 2020-12-09 LAB — BASIC METABOLIC PANEL
Anion gap: 10 (ref 5–15)
BUN: 14 mg/dL (ref 6–20)
CO2: 26 mmol/L (ref 22–32)
Calcium: 9.4 mg/dL (ref 8.9–10.3)
Chloride: 101 mmol/L (ref 98–111)
Creatinine, Ser: 0.78 mg/dL (ref 0.44–1.00)
GFR, Estimated: >60 mL/min (ref >60)
Glucose, Bld: 105 mg/dL — ABNORMAL HIGH (ref 70–99)
Potassium: 4 mmol/L (ref 3.5–5.1)
Sodium: 137 mmol/L (ref 135–145)

## 2020-12-09 LAB — CBC WITH DIFFERENTIAL/PLATELET
Abs Immature Granulocytes: 0.02 10*3/uL (ref 0.00–0.07)
Basophils Absolute: 0.1 10*3/uL (ref 0.0–0.1)
Basophils Relative: 1 %
Eosinophils Absolute: 0.1 10*3/uL (ref 0.0–0.5)
Eosinophils Relative: 2 %
HCT: 43.6 % (ref 36.0–46.0)
Hemoglobin: 14.4 g/dL (ref 12.0–15.0)
Immature Granulocytes: 0 %
Lymphocytes Relative: 36 %
Lymphs Abs: 2.7 10*3/uL (ref 0.7–4.0)
MCH: 30.5 pg (ref 26.0–34.0)
MCHC: 33 g/dL (ref 30.0–36.0)
MCV: 92.4 fL (ref 80.0–100.0)
Monocytes Absolute: 0.6 10*3/uL (ref 0.1–1.0)
Monocytes Relative: 7 %
Neutro Abs: 4 10*3/uL (ref 1.7–7.7)
Neutrophils Relative %: 54 %
Platelets: 326 10*3/uL (ref 150–400)
RBC: 4.72 MIL/uL (ref 3.87–5.11)
RDW: 13.2 % (ref 11.5–15.5)
WBC: 7.5 10*3/uL (ref 4.0–10.5)
nRBC: 0 % (ref 0.0–0.2)

## 2020-12-09 LAB — I-STAT BETA HCG BLOOD, ED (MC, WL, AP ONLY): I-stat hCG, quantitative: <5 m[IU]/mL (ref <5)

## 2020-12-09 LAB — TROPONIN I (HIGH SENSITIVITY)
Troponin I (High Sensitivity): 3 ng/L (ref <18)
Troponin I (High Sensitivity): 2 ng/L (ref <18)

## 2020-12-09 MED ORDER — CYCLOBENZAPRINE HCL 10 MG PO TABS
10.0000 mg | ORAL_TABLET | Freq: Once | ORAL | Status: AC
  Administered 2020-12-09: 10 mg via ORAL
  Filled 2020-12-09: qty 1

## 2020-12-09 MED ORDER — METHYLPREDNISOLONE SODIUM SUCC 40 MG IJ SOLR
40.0000 mg | Freq: Once | INTRAMUSCULAR | Status: AC
  Administered 2020-12-09: 40 mg via INTRAMUSCULAR
  Filled 2020-12-09: qty 1

## 2020-12-09 MED ORDER — LIDOCAINE HCL (PF) 1 % IJ SOLN
INTRAMUSCULAR | Status: AC
  Filled 2020-12-09: qty 30

## 2020-12-09 MED ORDER — LIDOCAINE HCL (PF) 1 % IJ SOLN
2.0000 mL | Freq: Once | INTRAMUSCULAR | Status: AC
  Administered 2020-12-09: 2 mL via INTRADERMAL

## 2020-12-09 MED ORDER — IBUPROFEN 400 MG PO TABS
600.0000 mg | ORAL_TABLET | Freq: Once | ORAL | Status: AC
  Administered 2020-12-09: 600 mg via ORAL
  Filled 2020-12-09: qty 1

## 2020-12-09 MED ORDER — CYCLOBENZAPRINE HCL 10 MG PO TABS: 10.0000 mg | ORAL_TABLET | Freq: Three times a day (TID) | ORAL | 0 refills | Status: DC | PRN

## 2020-12-09 NOTE — ED Provider Notes (Signed)
[facility] EMERGENCY DEPARTMENT Provider Note   CSN: [id number] Arrival date & time: 12/09/20  1011     History Chief Complaint  Patient presents with  . Chest Pain    Christine Watson is a 50 y.o. female.  HPI  Left sided mid back pain that started 3 days ago, has gradually worsened  No pain in chest Has pain with moving neck and left arm as well, but feels it pulling in the area of her back No known injury, has started lifting 8 lb weights recently for exercise Hurts in the region when she takes a deep breath, denies SOB Felt chilled this AM, but no fevers Otherwise doing well Has tried topical hot/cold cream and tylenol without improvement  No N/T, weakness of extremities No history of IV drug use  No saddle anesthesia, changes in bowel or bladder function Has not had anything like this before Rates the pain 9/10   Past Medical History:  Diagnosis Date  . Hyperlipidemia    Phreesia 10/08/2020  . Hypertension    Phreesia 10/08/2020    There are no problems to display for this patient.   Past Surgical History:  Procedure Laterality Date  . APPENDECTOMY N/A    Phreesia 10/08/2020  . CHOLECYSTECTOMY N/A    Phreesia 10/08/2020  . LEEP  03/2020  . TUBAL LIGATION N/A    Phreesia 10/08/2020     OB History    Gravida  3   Para      Term      Preterm      AB      Living  3     SAB      IAB      Ectopic      Multiple      Live Births  3        Obstetric Comments  SVD x 3. Last delivery 1999        Family History  Problem Relation Age of Onset  . Hypertension Mother     Social History   Tobacco Use  . Smoking status: Never Smoker  . Smokeless tobacco: Never Used  Vaping Use  . Vaping Use: Never used  Substance Use Topics  . Alcohol use: Not Currently  . Drug use: Not Currently    Home Medications Prior to Admission medications   Medication Sig Start Date End Date Taking? Authorizing Provider   Ascorbic Acid (VITAMIN C) 1000 MG tablet Take 1,000 mg by mouth daily.   Yes [provider]  Calcium 500-125 MG-UNIT TABS Take 1 tablet by mouth.   Yes [provider]  cyclobenzaprine (FLEXERIL) 10 MG tablet Take 1 tablet (10 mg total) by mouth 3 (three) times daily as needed for muscle spasms. 12/09/20  Yes Jermari Tamargo, Solmon Ice, DO  lisinopril (ZESTRIL) 10 MG tablet Take 1 tablet (10 mg total) by mouth daily. 10/11/20  Yes Zonia Kief, Amy J, NP  Omega-3 Fatty Acids (FISH OIL) 1000 MG CAPS Take 1 capsule by mouth daily.   Yes [provider]    Allergies    Patient has no known allergies.  Review of Systems   Review of Systems  Constitutional: Positive for chills. Negative for diaphoresis, fatigue and fever.  HENT: Negative for congestion.   Eyes: Negative for visual disturbance.  Respiratory: Negative for cough, chest tightness and shortness of breath.   Cardiovascular: Negative for chest pain and leg swelling.  Gastrointestinal: Negative for abdominal pain, constipation, diarrhea, nausea and vomiting.  Genitourinary: Negative for difficulty urinating, dysuria and hematuria.  Musculoskeletal: Positive for back pain.  Skin: Negative for rash.  Neurological: Negative for syncope, weakness and numbness.    Physical Exam Updated Vital Signs BP (!) 106/57   Pulse (!) 51   Temp 98.3 F (36.8 C) (Oral)   Resp (!) 22   LMP 11/10/2020 (Approximate)   SpO2 96%   Physical Exam Constitutional:      General: She is not in acute distress.    Appearance: She is obese.  HENT:     Head: Normocephalic and atraumatic.  Cardiovascular:     Rate and Rhythm: Normal rate and regular rhythm.     Pulses:          Radial pulses are 2+ on the right side and 2+ on the left side.       Dorsalis pedis pulses are 2+ on the right side and 2+ on the left side.       Posterior tibial pulses are 2+ on the right side and 2+ on the left side.     Heart sounds: Normal heart sounds.  No murmur heard. No friction rub. No gallop.   Pulmonary:     Effort: Pulmonary effort is normal.     Breath sounds: Normal breath sounds.  Chest:     Chest wall: No tenderness.  Abdominal:     General: Bowel sounds are normal.     Palpations: Abdomen is soft.     Tenderness: There is no abdominal tenderness.  Musculoskeletal:     Right lower leg: No edema.     Left lower leg: No edema.     Comments: 5/5 strength BUE/BLE, hand grip.    Cervical ROM full in all fields  Thoaracic Back: No obvious deformities.  No TTP along Thoracic or lumbar spinous processes.  TTP along left rhomboids with tenderpoint medial to scapula on left.  ROM appears intact, with pulling on left with flexion.    Skin:    General: Skin is warm and dry.  Neurological:     General: No focal deficit present.     Mental Status: She is alert.     Comments: Sensation intact throughout BUE/BLE     ED Results / Procedures / Treatments   Labs (all labs ordered are listed, but only abnormal results are displayed) Labs Reviewed  BASIC METABOLIC PANEL - Abnormal; Notable for the following components:      Result Value   Glucose, Bld 105 (*)    All other components within normal limits  CBC WITH DIFFERENTIAL/PLATELET  I-STAT BETA HCG BLOOD, ED ([facility], [facility], [facility] ONLY)  TROPONIN I (HIGH SENSITIVITY)  TROPONIN I (HIGH SENSITIVITY)    EKG None  Radiology DG Chest 2 View  Result Date: 12/09/2020 CLINICAL DATA:  50 year old female with thoracic back pain. EXAM: CHEST - 2 VIEW COMPARISON:  None. FINDINGS: The mediastinal contours are within normal limits. No cardiomegaly. The lungs are clear bilaterally without evidence of focal consolidation, pleural effusion, or pneumothorax. No acute osseous abnormality. IMPRESSION: No acute cardiopulmonary process. Electronically Signed   By: Marliss Coots MD   On: 12/09/2020 11:49   DG Thoracic Spine 2 View  Result Date: 12/09/2020 CLINICAL DATA:  Thoracic back pain EXAM: THORACIC  SPINE 2 VIEWS COMPARISON:  None. FINDINGS: There is no evidence of thoracic spine fracture. Alignment is normal. Intervertebral disc heights are maintained. No other significant bone abnormalities are identified. IMPRESSION: Negative. Electronically Signed   By: Elaina Hoops  Patel M.D.   On: 12/09/2020 11:59    Procedures   PROCEDURE: Trigger Point INJECTION: Patient was given informed consent, signed copy in the chart. Appropriate time out was taken. Area prepped and draped in usual sterile fashion. Ethyl chloride was  used for local anesthesia. A 21 gauge 1 inch needle was used.. 1 cc of methylprednisolone 40 mg/ml plus  2 cc of 1% lidocaine without epinephrine was injected into the letf rhomboid trigger point using a palpatory approach.   The patient tolerated the procedure well. There were no complications. Post procedure instructions were given.  Medications Ordered in ED Medications  lidocaine (PF) (XYLOCAINE) 1 % injection (  Not Given 12/09/20 1310)  cyclobenzaprine (FLEXERIL) tablet 10 mg (10 mg Oral Given 12/09/20 1117)  ibuprofen (ADVIL) tablet 600 mg (600 mg Oral Given 12/09/20 1117)  methylPREDNISolone sodium succinate (SOLU-MEDROL) 40 mg/mL injection 40 mg (40 mg Intramuscular Given by Other 12/09/20 1311)  lidocaine (PF) (XYLOCAINE) 1 % injection 2 mL (2 mLs Intradermal Given by Other 12/09/20 1301)    ED Course  I have reviewed the triage vital signs and the nursing notes.  Pertinent labs & imaging results that were available during my care of the patient were reviewed by me and considered in my medical decision making (see chart for details).    MDM Rules/Calculators/A&P                          50 y.o. female with history of HTN who presents with left-sided thoracic back pain that started 3 days ago.  Has progressively worsened.  Does not seem to radiate, but does have pulling when she moves her neck and her left arm.  Pulses, sensation, muscular strength intact distally.  No concern  for dissection at this time.  She also has no abdominal pain.  Very low risk for cardiac event, will go ahead and obtain a troponin, given the chronicity of her symptoms, if negative then very low risk for cardiac etiology.  We will go ahead and obtain BMP, CBC, Trop.  EKG NSR.  Will obtain thoracic x-ray as well to rule out fracture, which is very unlikely.  Given Flexeril and ibuprofen for pain.  If work-up normal, can consider trigger point injection and discharged with PCP follow-up.  Labs WNL, negative Trop.  Given time frame, no need to repeat.  XR of thoracic spine negative.  Improving with ibuprofen and flexeril.  Discussed risks and benefits of trigger point injection and patient opted to proceed.  Procedure performed and tolerated per above.  Patient advised to watch for signs of infection, allergic reaction.  Advised of worsening at 24 hrs with soreness and improving around 48 hrs.  Can use ibuprofen 600mg  q6h prn pain and flexeril which will be prescribed.  F/U PCP 1 week if no improvement.   Final Clinical Impression(s) / ED Diagnoses Final diagnoses:  Back pain    Rx / DC Orders ED Discharge Orders         Ordered    cyclobenzaprine (FLEXERIL) 10 MG tablet  3 times daily PRN        12/09/20 1316           Damiyah Ditmars, 02/08/21, DO 12/09/20 1316    02/08/21, MD 12/10/20 1018

## 2020-12-09 NOTE — ED Triage Notes (Addendum)
Pt reports she is here today due to cp that radiates to her back & left arm pain. Pt reports stabbing pain. Pt reports pain is 9/10. Pt reports sob. Pt denies any cardiac history. Pt reports 3 days of pain.

## 2020-12-09 NOTE — ED Notes (Signed)
Pt d/c home per MD order. Discharge summary reviewed with pt, pt verbalizes understanding. No s/s of acute distress noted. Ambulatory off unit.  °

## 2020-12-09 NOTE — Discharge Instructions (Addendum)
Today you received an injection with corticosteroid. This injection is usually done in response to pain and inflammation. There is some "numbing" medicine also in the shot so the injected area may be numb and feel really good for the next couple of hours. The numbing medicine usually wears off in 2-3 hours though, and then your pain level will be right back where it was before the injection.   The actually benefit from the steroid injection is usually noticed in 2-7 days. You may actually experience a small (as in 10%) INCREASE in pain in the first 24 hours---that is common.   Things to watch out for that you should contact us or a health care provider urgently would include: 1. Unusual (as in more than 10%) increase in pain 2. New fever > 101.5 3. New swelling or redness of the injected area.  4. Streaking of red lines around the area injected.   You can use ibuprofen 600mg  every 6 hrs as needed for your pain.  You can also use flexeril which I will send to the pharmacy for you.

## 2020-12-09 NOTE — ED Notes (Signed)
Pt transported to xray 

## 2020-12-12 ENCOUNTER — Encounter: Payer: Self-pay | Admitting: Family

## 2021-01-10 NOTE — Progress Notes (Signed)
Patient ID: Christine Watson, female    DOB: 10-21-1970  MRN: [id number]  CC: Annual Physical Exam  Subjective: Christine Watson is a 50 y.o. female who presents for annual physical exam. Her concerns today include:  1. PAP SMEAR DUE: Visit 11/17/2020 at [facility] per MD note: Cervical dysplasia I told her I agree with the prior recommendation for six month follow up and recommend doing an colposcopy, biopsy and ECC.  Patient is self pay, so we gave her resources for BCCCP. They may start with a pap smear first before referring back to [place].   I also told her that we weill try and obtain the colposcopy report because if there was a visible, focal spot that was biopsied that came back as cin 2 and the provider felt that it was all removed with the biopsy, it will make me feel better about the LEEP only having cin 1  Patient told if having any issues with being seen sometime in the next month to let me know by calling or MyChart.   01/11/2021: Today patient reports plans to schedule with B[facility]CP soon.   2. HAIR LOSS: Shedding especially with brushing for at least 1 month. Found a bald spot in her head lateral right aspect of head. Area does not burn, itch, and non-painful. Has not recently changed hair products. Trying natural remedies for now. Not ready to go to Dermatology as of yet.    There are no problems to display for this patient.    Current Outpatient Medications on File Prior to Visit  Medication Sig Dispense Refill  . Ascorbic Acid (VITAMIN C) 1000 MG tablet Take 1,000 mg by mouth daily.    . Calcium 500-125 MG-UNIT TABS Take 1 tablet by mouth.    . cyclobenzaprine (FLEXERIL) 10 MG tablet Take 1 tablet (10 mg total) by mouth 3 (three) times daily as needed for muscle spasms. 15 tablet 0  . lisinopril (ZESTRIL) 10 MG tablet Take 1 tablet (10 mg total) by mouth daily. 90 tablet 0  . Omega-3 Fatty Acids (FISH OIL) 1000  MG CAPS Take 1 capsule by mouth daily.     No current facility-administered medications on file prior to visit.    No Known Allergies  Social History   Socioeconomic History  . Marital status: Divorced    Spouse name: Not on file  . Number of children: Not on file  . Years of education: Not on file  . Highest education level: Not on file  Occupational History  . Not on file  Tobacco Use  . Smoking status: Never Smoker  . Smokeless tobacco: Never Used  Vaping Use  . Vaping Use: Never used  Substance and Sexual Activity  . Alcohol use: Not Currently  . Drug use: Not Currently  . Sexual activity: Yes    Comment: tubal ligation  Other Topics Concern  . Not on file  Social History Narrative  . Not on file   Social Determinants of Health   Financial Resource Strain: Not on file  Food Insecurity: Not on file  Transportation Needs: Not on file  Physical Activity: Not on file  Stress: Not on file  Social Connections: Not on file  Intimate Partner Violence: Not on file    Family History  Problem Relation Age of Onset  . Hypertension Mother     Past Surgical History:  Procedure Laterality Date  . APPENDECTOMY N/A    [employer]rees[employer]  10/08/2020  . CHOLECYSTECTOMY N/A    [employer]rees[employer] 10/08/2020  . LEEP  03/2020  . TUBAL LIGATION N/A    [employer]reesia 10/08/2020    ROS: Review of Systems Negative except as stated above  PHYSICAL EXAM: BP 108/74 (BP Location: Left Arm, Patient Position: Sitting, Cuff Size: Normal)   Pulse 70   Temp 98.1 F (36.7 C)   Resp 16   Ht 4' 11.02" (1.499 m)   Wt 196 lb 6.4 oz (89.1 kg)   SpO2 100%   BMI 39.65 kg/m    Wt Readings from Last 3 Encounters:  01/11/21 196 lb 6.4 oz (89.1 kg)  11/17/20 196 lb 1.6 oz (89 kg)  10/11/20 194 lb 3.2 oz (88.1 kg)     Physical Exam HENT:     Head: Normocephalic and atraumatic.     Right Ear: Tympanic membrane, ear canal and external ear normal.     Left Ear: Tympanic membrane, ear canal and external  ear normal.     Nose: Nose normal.     Mouth/Throat:     Mouth: Mucous membranes are moist.     Pharynx: Oropharynx is clear.  Eyes:     Extraocular Movements: Extraocular movements intact.     Conjunctiva/sclera: Conjunctivae normal.     Pupils: Pupils are equal, round, and reactive to light.  Cardiovascular:     Rate and Rhythm: Normal rate and regular rhythm.     Pulses: Normal pulses.     Heart sounds: Normal heart sounds.  Pulmonary:     Effort: Pulmonary effort is normal.     Breath sounds: Normal breath sounds.  Chest:     Comments: Patient declined examination. Abdominal:     General: Bowel sounds are normal.     Palpations: Abdomen is soft.  Genitourinary:    Comments: Patient declined examination.  Musculoskeletal:        General: Normal range of motion.     Cervical back: Normal range of motion and neck supple.  Skin:    General: Skin is warm and dry.     Capillary Refill: Capillary refill takes less than 2 seconds.  Neurological:     General: No focal deficit present.     Mental Status: She is alert and oriented to person, place, and time.  Psychiatric:        Mood and Affect: Mood normal.        Behavior: Behavior normal.    ASSESSMENT AND PLAN: 1. Annual physical exam: - Counseled on 150 minutes of exercise per week as tolerated, healthy eating (including decreased daily intake of saturated fats, cholesterol, added sugars, sodium), STI prevention, and routine healthcare maintenance.  2. Screening for metabolic disorder: - BMP last obtained 12/09/2020. - Hepatic function panel to check liver function.   3. Screening for deficiency anemia: - CBC last obtained 12/09/2020.  4. Diabetes mellitus screening: - Hemoglobin A1c to screen for pre-diabetes/diabetes. - Hemoglobin A1c  5. Screening cholesterol level: - Lipid panel to screen for high cholesterol.  - Lipid panel  6. Thyroid disorder screen:  - TSH to check thyroid function.  - TSH+T4F+T3Free  7.  Need for hepatitis C screening test: - Hepatitis C antibody to screen for hepatitis C.  - Hepatitis C Antibody  8. Encounter for screening for HIV: - HIV antibody to screen for human immunodeficiency virus.  - HIV antibody (with reflex)  9. Colon cancer screening: - Referral to Gastroenterology for colon cancer screening by colonoscopy. - Ambulatory referral to  Gastroenterology  10. Need for Tdap vaccination: - Tdap administered today in office. - Tdap vaccine greater than or equal to 7yo IM  11. Cervical dysplasia: 12. Pap smear for cervical cancer screening: - History of cervical dysplasia.  - Referral to Breast & Cervical Cancer Control Program for screening. Patient has contact information and plans to schedule soon.  13. Encounter for vitamin deficiency screening: - Vitamin D, 25-hydroxy for screening.  - Vitamin D, 25-hydroxy  Patient was given the opportunity to ask questions.  Patient verbalized understanding of the plan and was able to repeat key elements of the plan. Patient was given clear instructions to go to Emergency Department or return to medical center if symptoms don't improve, worsen, or new problems develop.The patient verbalized understanding.   Orders Placed This Encounter  Procedures  . Tdap vaccine greater than or equal to 7yo IM  . Lipid panel  . Hemoglobin A1c  . Hepatitis C Antibody  . HIV antibody (with reflex)  . TSH+T4F+T3Free  . Vitamin D, 25-hydroxy  . Ambulatory referral to Gastroenterology     Requested Prescriptions    No prescriptions requested or ordered in this encounter   Follow-up with primary provider as scheduled.   Rema Fendt, NP

## 2021-01-11 ENCOUNTER — Encounter: Payer: Self-pay | Admitting: Family

## 2021-01-11 ENCOUNTER — Ambulatory Visit (INDEPENDENT_AMBULATORY_CARE_PROVIDER_SITE_OTHER): Payer: Self-pay | Admitting: Family

## 2021-01-11 ENCOUNTER — Other Ambulatory Visit: Payer: Self-pay

## 2021-01-11 VITALS — BP 108/74 | HR 70 | Temp 98.1°F | Resp 16 | Ht 59.02 in | Wt 196.4 lb

## 2021-01-11 DIAGNOSIS — Z124 Encounter for screening for malignant neoplasm of cervix: Secondary | ICD-10-CM

## 2021-01-11 DIAGNOSIS — Z130 Encounter for screening for diseases of the blood and blood-forming organs and certain disorders involving the immune mechanism: Secondary | ICD-10-CM

## 2021-01-11 DIAGNOSIS — Z1159 Encounter for screening for other viral diseases: Secondary | ICD-10-CM

## 2021-01-11 DIAGNOSIS — Z131 Encounter for screening for diabetes mellitus: Secondary | ICD-10-CM

## 2021-01-11 DIAGNOSIS — Z0000 Encounter for general adult medical examination without abnormal findings: Secondary | ICD-10-CM

## 2021-01-11 DIAGNOSIS — Z23 Encounter for immunization: Secondary | ICD-10-CM

## 2021-01-11 DIAGNOSIS — Z1211 Encounter for screening for malignant neoplasm of colon: Secondary | ICD-10-CM

## 2021-01-11 DIAGNOSIS — Z1329 Encounter for screening for other suspected endocrine disorder: Secondary | ICD-10-CM

## 2021-01-11 DIAGNOSIS — Z13220 Encounter for screening for lipoid disorders: Secondary | ICD-10-CM

## 2021-01-11 DIAGNOSIS — N879 Dysplasia of cervix uteri, unspecified: Secondary | ICD-10-CM

## 2021-01-11 DIAGNOSIS — Z1321 Encounter for screening for nutritional disorder: Secondary | ICD-10-CM

## 2021-01-11 DIAGNOSIS — Z13228 Encounter for screening for other metabolic disorders: Secondary | ICD-10-CM

## 2021-01-11 DIAGNOSIS — Z114 Encounter for screening for human immunodeficiency virus [HIV]: Secondary | ICD-10-CM

## 2021-01-11 NOTE — Patient Instructions (Signed)
 Annual physical exam and labs today.   Follow-up with primary provider as scheduled. Preventive Care 40-49 Years Old, Female Preventive care refers to lifestyle choices and visits with your health care provider that can promote health and wellness. This includes:  A yearly physical exam. This is also called an annual wellness visit.  Regular dental and eye exams.  Immunizations.  Screening for certain conditions.  Healthy lifestyle choices, such as: ? Eating a healthy diet. ? Getting regular exercise. ? Not using drugs or products that contain nicotine and tobacco. ? Limiting alcohol use. What can I expect for my preventive care visit? Physical exam Your health care provider will check your:  Height and weight. These may be used to calculate your BMI (body mass index). BMI is a measurement that tells if you are at a healthy weight.  Heart rate and blood pressure.  Body temperature.  Skin for abnormal spots. Counseling Your health care provider may ask you questions about your:  Past medical problems.  Family's medical history.  Alcohol, tobacco, and drug use.  Emotional well-being.  Home life and relationship well-being.  Sexual activity.  Diet, exercise, and sleep habits.  Work and work environment.  Access to firearms.  Method of birth control.  Menstrual cycle.  Pregnancy history. What immunizations do I need? Vaccines are usually given at various ages, according to a schedule. Your health care provider will recommend vaccines for you based on your age, medical history, and lifestyle or other factors, such as travel or where you work.   What tests do I need? Blood tests  Lipid and cholesterol levels. These may be checked every 5 years, or more often if you are over 50 years old.  Hepatitis C test.  Hepatitis B test. Screening  Lung cancer screening. You may have this screening every year starting at age 55 if you have a 30-pack-year history of  smoking and currently smoke or have quit within the past 15 years.  Colorectal cancer screening. ? All adults should have this screening starting at age 50 and continuing until age 75. ? Your health care provider may recommend screening at age 45 if you are at increased risk. ? You will have tests every 1-10 years, depending on your results and the type of screening test.  Diabetes screening. ? This is done by checking your blood sugar (glucose) after you have not eaten for a while (fasting). ? You may have this done every 1-3 years.  Mammogram. ? This may be done every 1-2 years. ? Talk with your health care provider about when you should start having regular mammograms. This may depend on whether you have a family history of breast cancer.  BRCA-related cancer screening. This may be done if you have a family history of breast, ovarian, tubal, or peritoneal cancers.  Pelvic exam and Pap test. ? This may be done every 3 years starting at age 21. ? Starting at age 30, this may be done every 5 years if you have a Pap test in combination with an HPV test. Other tests  STD (sexually transmitted disease) testing, if you are at risk.  Bone density scan. This is done to screen for osteoporosis. You may have this scan if you are at high risk for osteoporosis. Talk with your health care provider about your test results, treatment options, and if necessary, the need for more tests. Follow these instructions at home: Eating and drinking  Eat a diet that includes fresh fruits and   vegetables, whole grains, lean protein, and low-fat dairy products.  Take vitamin and mineral supplements as recommended by your health care provider.  Do not drink alcohol if: ? Your health care provider tells you not to drink. ? You are pregnant, may be pregnant, or are planning to become pregnant.  If you drink alcohol: ? Limit how much you have to 0-1 drink a day. ? Be aware of how much alcohol is in your  drink. In the [place] one drink equals one 12 oz bottle of beer (355 mL), one 5 oz glass of wine (148 mL), or one 1 oz glass of hard liquor (44 mL).   Lifestyle  Take daily care of your teeth and gums. Brush your teeth every morning and night with fluoride toothpaste. Floss one time each day.  Stay active. Exercise for at least 30 minutes 5 or more days each week.  Do not use any products that contain nicotine or tobacco, such as cigarettes, e-cigarettes, and chewing tobacco. If you need help quitting, ask your health care provider.  Do not use drugs.  If you are sexually active, practice safe sex. Use a condom or other form of protection to prevent STIs (sexually transmitted infections).  If you do not wish to become pregnant, use a form of birth control. If you plan to become pregnant, see your health care provider for a prepregnancy visit.  If told by your health care provider, take low-dose aspirin daily starting at age 50.  Find healthy ways to cope with stress, such as: ? Meditation, yoga, or listening to music. ? Journaling. ? Talking to a trusted person. ? Spending time with friends and family. Safety  Always wear your seat belt while driving or riding in a vehicle.  Do not drive: ? If you have been drinking alcohol. Do not ride with someone who has been drinking. ? When you are tired or distracted. ? While texting.  Wear a helmet and other protective equipment during sports activities.  If you have firearms in your house, make sure you follow all gun safety procedures. What's next?  Visit your health care provider once a year for an annual wellness visit.  Ask your health care provider how often you should have your eyes and teeth checked.  Stay up to date on all vaccines. This information is not intended to replace advice given to you by your health care provider. Make sure you discuss any questions you have with your health care provider. Document Revised:  05/31/2020 Document Reviewed: 05/08/2018 Elsevier Patient Education  2021 Elsevier Inc.  

## 2021-01-11 NOTE — Progress Notes (Signed)
Physical Info given for BCCP given to pt for cervical screening  Pt experiencing hair loss- bald spot on right side of head noticed for about 1 month  Wants tetanus today Tetanus administered in right deltoid

## 2021-01-12 ENCOUNTER — Encounter: Payer: Self-pay | Admitting: Internal Medicine

## 2021-01-12 ENCOUNTER — Other Ambulatory Visit: Payer: Self-pay | Admitting: Family

## 2021-01-12 DIAGNOSIS — E559 Vitamin D deficiency, unspecified: Secondary | ICD-10-CM | POA: Insufficient documentation

## 2021-01-12 LAB — LIPID PANEL
Chol/HDL Ratio: 3.8 ratio (ref 0.0–4.4)
Cholesterol, Total: 201 mg/dL — ABNORMAL HIGH (ref 100–199)
HDL: 53 mg/dL (ref >39)
LDL Chol Calc (NIH): 127 mg/dL — ABNORMAL HIGH (ref 0–99)
Triglycerides: 119 mg/dL (ref 0–149)
VLDL Cholesterol Cal: 21 mg/dL (ref 5–40)

## 2021-01-12 LAB — HIV ANTIBODY (ROUTINE TESTING W REFLEX): HIV Screen 4th Generation wRfx: NONREACTIVE

## 2021-01-12 LAB — HEMOGLOBIN A1C
Est. average glucose Bld gHb Est-mCnc: 105 mg/dL
Hgb A1c MFr Bld: 5.3 % (ref 4.8–5.6)

## 2021-01-12 LAB — VITAMIN D 25 HYDROXY (VIT D DEFICIENCY, FRACTURES): Vit D, 25-Hydroxy: 15.5 ng/mL — ABNORMAL LOW (ref 30.0–100.0)

## 2021-01-12 LAB — HEPATIC FUNCTION PANEL
ALT: 29 IU/L (ref 0–32)
AST: 19 IU/L (ref 0–40)
Albumin: 4.6 g/dL (ref 3.8–4.8)
Alkaline Phosphatase: 90 IU/L (ref 44–121)
Bilirubin Total: 0.5 mg/dL (ref 0.0–1.2)
Bilirubin, Direct: 0.12 mg/dL (ref 0.00–0.40)
Total Protein: 7.8 g/dL (ref 6.0–8.5)

## 2021-01-12 LAB — HEPATITIS C ANTIBODY: Hep C Virus Ab: 0.1 s/co ratio (ref 0.0–0.9)

## 2021-01-12 LAB — TSH+T4F+T3FREE
Free T4: 1.35 ng/dL (ref 0.82–1.77)
T3, Free: 3.7 pg/mL (ref 2.0–4.4)
TSH: 2.18 u[IU]/mL (ref 0.450–4.500)

## 2021-01-12 MED ORDER — VITAMIN D3 25 MCG (1000 UT) PO CAPS: 1000.0000 [IU] | ORAL_CAPSULE | Freq: Every day | ORAL | 0 refills | Status: AC

## 2021-01-12 NOTE — Progress Notes (Signed)
Liver function normal.   Thyroid function normal.   No diabetes.  Hepatitis C negative.   HIV negative.   Vitamin D level low. A prescription for vitamin D sent to your pharmacy. Take 1 daily for the next 16 weeks. Patient encouraged to have rechecked after completion of 16 weeks.  Cholesterol higher than expected. High cholesterol may increase risk of heart attack and/or stroke. Consider eating more fruits, vegetables, and lean baked meats such as chicken or fish. Moderate intensity exercise at least 150 minutes as tolerated per week may help as well.   The following is for provider reference only: The 10-year ASCVD risk score  is: 1.1%   Values used to calculate the score:     Age: 50 years     Sex: Female     Is Non-Hispanic African American: No     Diabetic: No     Tobacco smoker: No     Systolic Blood Pressure: 108 mmHg     Is BP treated: Yes     HDL Cholesterol: 53 mg/dL     Total Cholesterol: 201 mg/dL

## 2021-01-16 ENCOUNTER — Other Ambulatory Visit: Payer: Self-pay

## 2021-01-16 DIAGNOSIS — Z1231 Encounter for screening mammogram for malignant neoplasm of breast: Secondary | ICD-10-CM

## 2021-01-16 NOTE — Progress Notes (Signed)
z12.31

## 2021-02-09 ENCOUNTER — Ambulatory Visit: Payer: Self-pay | Admitting: *Deleted

## 2021-02-09 ENCOUNTER — Other Ambulatory Visit: Payer: Self-pay

## 2021-02-09 ENCOUNTER — Ambulatory Visit
Admission: RE | Admit: 2021-02-09 | Discharge: 2021-02-09 | Disposition: A | Discharge status: Home / Self Care | Payer: No Typology Code available for payment source | Source: Ambulatory Visit | Attending: Obstetrics and Gynecology | Admitting: Obstetrics and Gynecology

## 2021-02-09 VITALS — BP 126/88 | Wt 200.0 lb

## 2021-02-09 DIAGNOSIS — Z01419 Encounter for gynecological examination (general) (routine) without abnormal findings: Secondary | ICD-10-CM

## 2021-02-09 DIAGNOSIS — Z1231 Encounter for screening mammogram for malignant neoplasm of breast: Secondary | ICD-10-CM

## 2021-02-09 NOTE — Patient Instructions (Signed)
Explained breast self awareness with Marguerita Beards. Pap smear completed. Let her know that her next Pap smear will be due based on the result of today's Pap smear. Referred patient to the [facility] for a screening mammogram on the mobile unit. Appointment scheduled Thursday, February 09, 2021 at 1430. Patient escorted to the mobile unit following BCCCP appointment for her screening mammogram. Let patient know will follow up with her within the next week with results of her wet prep and Pap smear by phone. Informed patient that the Breast Center will follow-up with her within the next couple of weeks with results of her mammogram by letter or phone. Tamyrah Burbage verbalized understanding.  Jonessa Triplett, Kathaleen Maser, RN 1:28 PM

## 2021-02-09 NOTE — Progress Notes (Signed)
Christine Watson is a 50 y.o. G3P0 female who presents to [facility] clinic today with no complaints.    Pap Smear: Pap smear completed today. Last Pap smear was 01/22/2020 at [facility] clinic in [place] and was abnormal - HSIL with positive HPV. 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 Patient had a LEEP completed 04/07/2020 that showed CIN-I with negative margins. Six month follow-up Pap smear was recommended for follow-up that was not completed. Per patient her last Pap smear is the only abnormal Pap smear she has had. Last Pap smear result is available in Epic.   Physical exam: Breasts Breasts symmetrical. No skin abnormalities bilateral breasts. No nipple retraction bilateral breasts. No nipple discharge bilateral breasts. No lymphadenopathy. No lumps palpated bilateral breasts. No complaints of pain or tenderness on exam.     Pelvic/Bimanual Ext Genitalia No lesions, no swelling and no discharge observed on external genitalia.        Vagina Vagina pink and normal texture. No lesions and white frothy discharge observed in vagina. Wet prep completed.        Cervix Cervix is present. Cervix pink and of normal texture. Cervix friable. No discharge observed.    Uterus Uterus is present and palpable. Uterus in normal position and normal size.        Adnexae Bilateral ovaries present and palpable. No tenderness on palpation.         Rectovaginal No rectal exam completed today. Patient complained of possible Hemorrhoids. Patient stated she has a PCP and informed patient to follow-up with her PCP. No skin abnormalities observed on exam.     Smoking History: Patient has never smoked.  Patient Navigation: Patient education provided. Access to services provided for patient through BCCCP program.   Colorectal Cancer Screening: Per patient has never had colonoscopy completed. No complaints today.    Breast and Cervical Cancer Risk  Assessment: Patient does not have family history of breast cancer, known genetic mutations, or radiation treatment to the chest before age 41. Patient has history of cervical dysplasia. Patient has no history of being immunocompromised or DES exposure in-utero.  Risk Assessment    Risk Scores      02/09/2021   Last edited by: Meryl Dare, CMA   5-year risk: 0.8 %   Lifetime risk: 7 %          A: BCCCP exam with pap smear No complaints.   P: Referred patient to the [facility] for a screening mammogram on the mobile unit. Appointment scheduled Thursday, February 09, 2021 at 1430.  Priscille Heidelberg, RN 02/09/2021 1:27 PM

## 2021-02-10 ENCOUNTER — Telehealth: Payer: Self-pay

## 2021-02-10 ENCOUNTER — Other Ambulatory Visit: Payer: Self-pay

## 2021-02-10 DIAGNOSIS — B9689 Other specified bacterial agents as the cause of diseases classified elsewhere: Secondary | ICD-10-CM

## 2021-02-10 LAB — CERVICOVAGINAL ANCILLARY ONLY
Bacterial Vaginitis (gardnerella): POSITIVE — AB
Candida Glabrata: NEGATIVE
Candida Vaginitis: NEGATIVE
Comment: NEGATIVE
Comment: NEGATIVE
Comment: NEGATIVE
Comment: NEGATIVE
Trichomonas: NEGATIVE

## 2021-02-10 MED ORDER — METRONIDAZOLE 500 MG PO TABS: 500.0000 mg | ORAL_TABLET | Freq: Two times a day (BID) | ORAL | 0 refills | Status: DC

## 2021-02-10 NOTE — Telephone Encounter (Signed)
Spoke with patient to give wet prep results. Informed patient that wet prep did show bacterial vaginosis. Will prescribe Flagyl to be taken BID for 7 days. Instructed patient to refrain from alcohol while taking this medication. Patient voiced understanding.

## 2021-02-13 LAB — CYTOLOGY - PAP
Comment: NEGATIVE
Diagnosis: UNDETERMINED — AB
High risk HPV: NEGATIVE

## 2021-02-14 ENCOUNTER — Telehealth: Payer: Self-pay

## 2021-02-14 ENCOUNTER — Other Ambulatory Visit: Payer: Self-pay

## 2021-02-14 ENCOUNTER — Ambulatory Visit (INDEPENDENT_AMBULATORY_CARE_PROVIDER_SITE_OTHER): Payer: Self-pay | Admitting: *Deleted

## 2021-02-14 VITALS — Ht 59.0 in | Wt 202.6 lb

## 2021-02-14 DIAGNOSIS — Z1211 Encounter for screening for malignant neoplasm of colon: Secondary | ICD-10-CM

## 2021-02-14 MED ORDER — PEG 3350-KCL-NA BICARB-NACL 420 G PO SOLR: 4000.0000 mL | Freq: Once | ORAL | 0 refills | Status: AC

## 2021-02-14 NOTE — Telephone Encounter (Signed)
Spoke with patient to give pap smear results. Informed patient that pap was ASCUS, - HPV and that per Dr. Jolayne Panther next pap smear will be due in 1 year. Patient voiced understanding.

## 2021-02-14 NOTE — Patient Instructions (Signed)
Christine Watson Record   Jan 02, 1971 MRN: [id number]    Procedure Date:  03/27/2021 Time to register: 8:30 AM Place to register: [facility] Scheduled provider: Dr. Abbey Chatters  PREPARATION FOR COLONOSCOPY WITH TRI-LYTE SPLIT PREP  Please notify us immediately if you are diabetic, take iron supplements, or if you are on Coumadin or any other blood thinners.   Please hold the following medications: n/a  You will need to purchase 1 fleet enema and 1 box of Bisacodyl 62m tablets.   2 DAYS BEFORE PROCEDURE:  DATE: 03/25/2021   DAY: Saturday Begin clear liquid diet AFTER your lunch meal. NO SOLID FOODS after this point.  1 DAY BEFORE PROCEDURE:  DATE: 03/26/2021   DAY: Sunday Continue clear liquids the entire day - NO SOLID FOOD.   Diabetic medications adjustments for today: n/a  At 2:00 pm:  Take 2 Bisacodyl tablets.   At 4:00pm:  Start drinking your solution. Make sure you mix well per instructions on the bottle. Try to drink 1 (one) 8 ounce glass every 10-15 minutes until you have consumed HALF the jug. You should complete by 6:00pm.You must keep the left over solution refrigerated until completed next day.  Continue clear liquids. You must drink plenty of clear liquids to prevent dehyration and kidney failure.     DAY OF PROCEDURE:   DATE: 03/27/2021   DAY: Monday If you take medications for your heart, blood pressure or breathing, you may take these medications.  Diabetic medications adjustments for today: n/a  Five hours before your procedure time @ 5:00 am:  Finish remaining amout of bowel prep, drinking 1 (one) 8 ounce glass every 10-15 minutes until complete. You have two hours to consume remaining prep.   Three hours before your procedure time @ 7:00 am:  Nothing by mouth.   At least one hour before going to the hospital:  Give yourself one Fleet enema. You may take your morning medications with sip of water unless we have instructed otherwise.      Please see below  for Dietary Information.  CLEAR LIQUIDS INCLUDE:  Water Jello (NOT red in color)   Ice Popsicles (NOT red in color)   Tea (sugar ok, no milk/cream) Powdered fruit flavored drinks  Coffee (sugar ok, no milk/cream) Gatorade/ Lemonade/ Kool-Aid  (NOT red in color)   Juice: apple, white grape, white cranberry Soft drinks  Clear bullion, consomme, broth (fat free beef/chicken/vegetable)  Carbonated beverages (any kind)  Strained chicken noodle soup Hard Candy   Remember: Clear liquids are liquids that will allow you to see your fingers on the other side of a clear glass. Be sure liquids are NOT red in color, and not cloudy, but CLEAR.  DO NOT EAT OR DRINK ANY OF THE FOLLOWING:  Dairy products of any kind   Cranberry juice Tomato juice / V8 juice   Grapefruit juice Orange juice     Red grape juice  Do not eat any solid foods, including such foods as: cereal, oatmeal, yogurt, fruits, vegetables, creamed soups, eggs, bread, crackers, pureed foods in a blender, etc.   HELPFUL HINTS FOR DRINKING PREP SOLUTION:   Make sure prep is extremely cold. Mix and refrigerate the the morning of the prep. You may also put in the freezer.   You may try mixing some Crystal Light or Country Time Lemonade if you prefer. Mix in small amounts; add more if necessary.  Try drinking through a straw  Rinse mouth with water or a mouthwash between  glasses, to remove after-taste.  Try sipping on a cold beverage /ice/ popsicles between glasses of prep.  Place a piece of sugar-free hard candy in mouth between glasses.  If you become nauseated, try consuming smaller amounts, or stretch out the time between glasses. Stop for 30-60 minutes, then slowly start back drinking.        OTHER INSTRUCTIONS  You will need a responsible adult at least 49 years of age to accompany you and drive you home. This person must remain in the waiting room during your procedure. The hospital will cancel your procedure if you do  not have a responsible adult with you.   1. Wear loose fitting clothing that is easily removed. 2. Leave jewelry and other valuables at home.  3. Remove all body piercing jewelry and leave at home. 4. Total time from sign-in until discharge is approximately 2-3 hours. 5. You should go home directly after your procedure and rest. You can resume normal activities the day after your procedure. 6. The day of your procedure you should not:  Drive  Make legal decisions  Operate machinery  Drink alcohol  Return to work   You may call the office (Dept: [phone]) before 5:00pm, or page the doctor on call [phone]) after 5:00pm, for further instructions, if necessary.   Insurance Information YOU WILL NEED TO CHECK WITH YOUR INSURANCE COMPANY FOR THE BENEFITS OF COVERAGE YOU HAVE FOR THIS PROCEDURE.  UNFORTUNATELY, NOT ALL INSURANCE COMPANIES HAVE BENEFITS TO COVER ALL OR PART OF THESE TYPES OF PROCEDURES.  IT IS YOUR RESPONSIBILITY TO CHECK YOUR BENEFITS, HOWEVER, WE WILL BE GLAD TO ASSIST YOU WITH ANY CODES YOUR INSURANCE COMPANY MAY NEED.    PLEASE NOTE THAT MOST INSURANCE COMPANIES WILL NOT COVER A SCREENING COLONOSCOPY FOR PEOPLE UNDER THE AGE OF 49  IF YOU HAVE BCBS INSURANCE, YOU MAY HAVE BENEFITS FOR A SCREENING COLONOSCOPY BUT IF POLYPS ARE FOUND THE DIAGNOSIS WILL CHANGE AND THEN YOU MAY HAVE A DEDUCTIBLE THAT WILL NEED TO BE MET. SO PLEASE MAKE SURE YOU CHECK YOUR BENEFITS FOR A SCREENING COLONOSCOPY AS WELL AS A DIAGNOSTIC COLONOSCOPY.

## 2021-02-14 NOTE — Progress Notes (Signed)
Gastroenterology Pre-Procedure Review  Request Date: 02/14/2021 Requesting Physician: Ricky Stabs, NP, no previous TCS  PATIENT REVIEW QUESTIONS: The patient responded to the following health history questions as indicated:    1. Diabetes Melitis: no 2. Joint replacements in the past 12 months: no 3. Major health problems in the past 3 months: no 4. Has an artificial valve or MVP: no 5. Has a defibrillator: no 6. Has been advised in past to take antibiotics in advance of a procedure like teeth cleaning: no 7. Family history of colon cancer: no  8. Alcohol Use: no 9. Illicit drug Use: no 10. History of sleep apnea: no  11. History of coronary artery or other vascular stents placed within the last 12 months: no 12. History of any prior anesthesia complications: no 13. Body mass index is 40.92 kg/m.    MEDICATIONS & ALLERGIES:    Patient reports the following regarding taking any blood thinners:   Plavix? no Aspirin? no Coumadin? no Brilinta? no Xarelto? no Eliquis? no Pradaxa? no Savaysa? no Effient? no  Patient confirms/reports the following medications:  Current Outpatient Medications  Medication Sig Dispense Refill  . Cholecalciferol (VITAMIN D3) 25 MCG (1000 UT) CAPS Take 1 capsule (1,000 Units total) by mouth daily. 120 capsule 0  . lisinopril (ZESTRIL) 10 MG tablet Take 1 tablet (10 mg total) by mouth daily. 90 tablet 0   No current facility-administered medications for this visit.    Patient confirms/reports the following allergies:  No Known Allergies  No orders of the defined types were placed in this encounter.   AUTHORIZATION INFORMATION Primary Insurance: [employer] Pending Pre-Cert / Auth required: No, not required  SCHEDULE INFORMATION: Procedure has been scheduled as follows:  Date: 03/27/2021, Time: 10:00 Location: [facility] with Dr. Marletta Lor  This Gastroenterology Pre-Precedure Review Form is being routed to the following provider(s): Tana Coast, PA-C

## 2021-02-22 NOTE — Progress Notes (Signed)
Dr. Marletta Lor, patient is ASA III due to BMI of 40.92. Can we triage her?

## 2021-02-23 NOTE — Progress Notes (Addendum)
Discussed with Dr. Marletta Lor. Ok to triage. ASA III.

## 2021-02-27 ENCOUNTER — Encounter: Payer: Self-pay | Admitting: *Deleted

## 2021-02-27 NOTE — Progress Notes (Signed)
Mailed Pre-op letter to pt.

## 2021-03-08 ENCOUNTER — Ambulatory Visit: Payer: Self-pay

## 2021-03-20 ENCOUNTER — Telehealth: Payer: Self-pay | Admitting: *Deleted

## 2021-03-20 NOTE — Telephone Encounter (Signed)
Pt called in and requested to reschedule her procedure.  She left a voice message for me to call her back.

## 2021-03-21 NOTE — Telephone Encounter (Signed)
Spoke to pt.  She informed me that she would like to cancel her procedure on 03/27/2021.  She is waiting on insurance.  Pt said that she will call us back once she gets this straightened out.  Called and made [place] in [facility] aware.

## 2021-03-21 NOTE — Telephone Encounter (Signed)
Noted  

## 2021-03-24 ENCOUNTER — Encounter (HOSPITAL_COMMUNITY): Payer: No Typology Code available for payment source

## 2021-03-27 ENCOUNTER — Ambulatory Visit (HOSPITAL_COMMUNITY): Admission: RE | Admit: 2021-03-27 | Payer: No Typology Code available for payment source | Source: Home / Self Care

## 2021-03-27 ENCOUNTER — Encounter (HOSPITAL_COMMUNITY): Admission: RE | Payer: Self-pay | Source: Home / Self Care

## 2021-03-27 SURGERY — COLONOSCOPY WITH PROPOFOL
Anesthesia: Monitor Anesthesia Care

## 2022-01-16 ENCOUNTER — Encounter (HOSPITAL_COMMUNITY): Payer: Self-pay | Admitting: Emergency Medicine

## 2022-01-16 ENCOUNTER — Emergency Department (HOSPITAL_COMMUNITY)
Admission: EM | Admit: 2022-01-16 | Discharge: 2022-01-16 | Disposition: A | Discharge status: Home / Self Care | Payer: Managed Care, Other (non HMO) | Attending: Emergency Medicine | Admitting: Emergency Medicine

## 2022-01-16 ENCOUNTER — Other Ambulatory Visit: Payer: Self-pay

## 2022-01-16 ENCOUNTER — Emergency Department (HOSPITAL_COMMUNITY): Payer: Managed Care, Other (non HMO)

## 2022-01-16 DIAGNOSIS — M545 Low back pain, unspecified: Secondary | ICD-10-CM | POA: Diagnosis not present

## 2022-01-16 DIAGNOSIS — Y990 Civilian activity done for income or pay: Secondary | ICD-10-CM | POA: Diagnosis not present

## 2022-01-16 DIAGNOSIS — W19XXXA Unspecified fall, initial encounter: Secondary | ICD-10-CM | POA: Insufficient documentation

## 2022-01-16 DIAGNOSIS — S0990XA Unspecified injury of head, initial encounter: Secondary | ICD-10-CM | POA: Diagnosis present

## 2022-01-16 DIAGNOSIS — Z79899 Other long term (current) drug therapy: Secondary | ICD-10-CM | POA: Insufficient documentation

## 2022-01-16 MED ORDER — KETOROLAC TROMETHAMINE 30 MG/ML IJ SOLN
15.0000 mg | Freq: Once | INTRAMUSCULAR | Status: AC
  Administered 2022-01-16: 15 mg via INTRAMUSCULAR
  Filled 2022-01-16: qty 1

## 2022-01-16 NOTE — ED Provider Notes (Signed)
?[facility] ?Provider Note ? ? ?CSN: [id number] ?Arrival date & time: 01/16/22  1102 ? ?  ? ?History ? ?Chief Complaint  ?Patient presents with  ? Fall  ? ? ?Christine Watson is a 51 y.o. female. ? ?HPI ?Patient presents with headache, dizziness, lightheadedness, nausea.  Onset was 2 days ago after she had a fall, striking her head.  She notes that she was turning into a hammock, when that did not occur.  She struck her head, head no loss of consciousness, but since that time has had persistent symptoms no relief with OTC medication.  No focal weakness in any particular extremity, no new vision loss.  With persistent symptoms she presents for evaluation ?  ? ?Home Medications ?Prior to Admission medications   ?Medication Sig Start Date End Date Taking? Authorizing Provider  ?acetaminophen (TYLENOL) 650 MG CR tablet Take 650 mg by mouth every 8 (eight) hours as needed for pain.    [provider]  ?lisinopril (ZESTRIL) 10 MG tablet Take 1 tablet (10 mg total) by mouth daily. 10/11/20   Camillia Herter, NP  ?Skin Oils (CASTOR OIL EX) Apply 1 application topically in the morning and at bedtime.    [provider]  ?   ? ?Allergies    ?Patient has no known allergies.   ? ?Review of Systems   ?Review of Systems  ?All other systems reviewed and are negative. ? ?Physical Exam ?Updated Vital Signs ?BP 133/70 (BP Location: Right Arm)   Pulse 65   Temp 98.2 ?F (36.8 ?C) (Oral)   Resp 20   Ht 4\' 11"  (1.499 m)   Wt 90.7 kg   LMP 01/01/2022 (Approximate)   SpO2 99%   BMI 40.40 kg/m?  ?Physical Exam ?Vitals and nursing note reviewed.  ?Constitutional:   ?   General: She is not in acute distress. ?   Appearance: She is well-developed.  ?HENT:  ?   Head: Normocephalic and atraumatic.  ?   Comments: Atraumatic, but patient describes pain throughout the occiput ?Eyes:  ?   Conjunctiva/sclera: Conjunctivae normal.  ?Cardiovascular:  ?   Rate and Rhythm: Normal rate and regular rhythm.   ?Pulmonary:  ?   Effort: Pulmonary effort is normal. No respiratory distress.  ?   Breath sounds: Normal breath sounds. No stridor.  ?Abdominal:  ?   General: There is no distension.  ?Musculoskeletal:  ?   Cervical back: Tenderness present.  ?Skin: ?   General: Skin is warm and dry.  ?Neurological:  ?   General: No focal deficit present.  ?   Mental Status: She is alert and oriented to person, place, and time.  ?   Cranial Nerves: No cranial nerve deficit.  ?Psychiatric:     ?   Mood and Affect: Mood normal.  ? ? ?ED Results / Procedures / Treatments   ?Labs ?(all labs ordered are listed, but only abnormal results are displayed) ?Labs Reviewed - No data to display ? ?EKG ?None ? ?Radiology ?CT Head Wo Contrast ? ?Result Date: 01/16/2022 ?CLINICAL DATA:  Polytrauma, blunt EXAM: CT HEAD WITHOUT CONTRAST CT CERVICAL SPINE WITHOUT CONTRAST TECHNIQUE: Multidetector CT imaging of the head and cervical spine was performed following the standard protocol without intravenous contrast. Multiplanar CT image reconstructions of the cervical spine were also generated. RADIATION DOSE REDUCTION: This exam was performed according to the departmental dose-optimization program which includes automated exposure control, adjustment of the mA and/or kV according to patient size and/or  use of iterative reconstruction technique. COMPARISON:  None Available. FINDINGS: CT HEAD FINDINGS Brain: No evidence of acute infarction, hemorrhage, hydrocephalus, extra-axial collection or mass lesion/mass effect. Partially empty sella. Vascular: No hyperdense vessel identified. Skull: No acute fracture. Sinuses/Orbits: Clear sinuses.  No acute orbital findings. Other: No mastoid effusions. CT CERVICAL SPINE FINDINGS Alignment: Straightening without substantial sagittal subluxation. Skull base and vertebrae: Vertebral body heights are maintained. Soft tissues and spinal canal: No prevertebral fluid or swelling. No visible canal hematoma. Disc levels:  Multilevel facet and uncovertebral hypertrophy with varying degrees of neural foraminal stenosis. Moderate multilevel degenerative disease including disc height loss and endplate spurring. Upper chest: Visualized lung apices are clear. Other: Nonspecific prominent upper cervical chain nodes bilaterally, some of which are borderline enlarged. Approximately 1.3 cm right thyroid nodule, which does not require further imaging follow-up (ref: J Am Coll Radiol. 2015 Feb;12(2): 143-50). IMPRESSION: CT head: 1. No evidence of acute intracranial abnormality. 2. Partially empty sella, which is often a normal anatomic variant but can be associated with idiopathic intracranial hypertension. CT cervical spine: 1. No evidence of acute fracture or traumatic malalignment. 2. Multilevel degenerative change, described above. 3. Nonspecific prominent upper cervical chain nodes bilaterally, some of which are borderline enlarged. Electronically Signed   By: Margaretha Sheffield M.D.   On: 01/16/2022 12:12  ? ?CT Cervical Spine Wo Contrast ? ?Result Date: 01/16/2022 ?CLINICAL DATA:  Polytrauma, blunt EXAM: CT HEAD WITHOUT CONTRAST CT CERVICAL SPINE WITHOUT CONTRAST TECHNIQUE: Multidetector CT imaging of the head and cervical spine was performed following the standard protocol without intravenous contrast. Multiplanar CT image reconstructions of the cervical spine were also generated. RADIATION DOSE REDUCTION: This exam was performed according to the departmental dose-optimization program which includes automated exposure control, adjustment of the mA and/or kV according to patient size and/or use of iterative reconstruction technique. COMPARISON:  None Available. FINDINGS: CT HEAD FINDINGS Brain: No evidence of acute infarction, hemorrhage, hydrocephalus, extra-axial collection or mass lesion/mass effect. Partially empty sella. Vascular: No hyperdense vessel identified. Skull: No acute fracture. Sinuses/Orbits: Clear sinuses.  No acute  orbital findings. Other: No mastoid effusions. CT CERVICAL SPINE FINDINGS Alignment: Straightening without substantial sagittal subluxation. Skull base and vertebrae: Vertebral body heights are maintained. Soft tissues and spinal canal: No prevertebral fluid or swelling. No visible canal hematoma. Disc levels: Multilevel facet and uncovertebral hypertrophy with varying degrees of neural foraminal stenosis. Moderate multilevel degenerative disease including disc height loss and endplate spurring. Upper chest: Visualized lung apices are clear. Other: Nonspecific prominent upper cervical chain nodes bilaterally, some of which are borderline enlarged. Approximately 1.3 cm right thyroid nodule, which does not require further imaging follow-up (ref: J Am Coll Radiol. 2015 Feb;12(2): 143-50). IMPRESSION: CT head: 1. No evidence of acute intracranial abnormality. 2. Partially empty sella, which is often a normal anatomic variant but can be associated with idiopathic intracranial hypertension. CT cervical spine: 1. No evidence of acute fracture or traumatic malalignment. 2. Multilevel degenerative change, described above. 3. Nonspecific prominent upper cervical chain nodes bilaterally, some of which are borderline enlarged. Electronically Signed   By: Margaretha Sheffield M.D.   On: 01/16/2022 12:12   ? ?Procedures ?Procedures  ? ? ?Medications Ordered in ED ?Medications  ?ketorolac (TORADOL) 30 MG/ML injection 15 mg (15 mg Intramuscular Given 01/16/22 1229)  ? ? ?ED Course/ Medical Decision Making/ A&P ?This patient with a Hx of obesity, hyperlipidemia presents to the ED for concern of head pain, neck pain, dizziness, lightheadedness following a fall with  head trauma, this involves an extensive number of treatment options, and is a complaint that carries with it a high risk of complications and morbidity.   ? ?The differential diagnosis includes intracranial abnormality/bleed, concussion, fracture ? ? ?Social Determinants of  Health: ? ?Obesity ? ? ? ?After the initial evaluation, orders, including: CT head, neck were initiated. ? ? ?The patient was also maintained on pulse oximetry. The readings were typically  100% room air normal ? ? ?On repeat

## 2022-01-16 NOTE — ED Triage Notes (Signed)
Pt fell out of hammock last Friday, striking back of head, currently having headache with ringing in her ears and having hard to concentrating at work. ?

## 2022-01-18 ENCOUNTER — Other Ambulatory Visit: Payer: Self-pay | Admitting: Family

## 2022-01-18 DIAGNOSIS — Z1231 Encounter for screening mammogram for malignant neoplasm of breast: Secondary | ICD-10-CM

## 2022-02-12 ENCOUNTER — Ambulatory Visit: Payer: Self-pay

## 2022-02-15 ENCOUNTER — Ambulatory Visit
Admission: RE | Admit: 2022-02-15 | Discharge: 2022-02-15 | Disposition: A | Discharge status: Home / Self Care | Payer: Managed Care, Other (non HMO) | Source: Ambulatory Visit | Attending: Family | Admitting: Family

## 2022-02-15 DIAGNOSIS — Z1231 Encounter for screening mammogram for malignant neoplasm of breast: Secondary | ICD-10-CM

## 2022-03-22 ENCOUNTER — Other Ambulatory Visit (HOSPITAL_COMMUNITY)
Admission: RE | Admit: 2022-03-22 | Discharge: 2022-03-22 | Disposition: A | Discharge status: Home / Self Care | Payer: Managed Care, Other (non HMO) | Source: Ambulatory Visit | Attending: Family Medicine | Admitting: Family Medicine

## 2022-03-22 ENCOUNTER — Encounter: Payer: Self-pay | Admitting: Family Medicine

## 2022-03-22 ENCOUNTER — Ambulatory Visit (INDEPENDENT_AMBULATORY_CARE_PROVIDER_SITE_OTHER): Payer: Managed Care, Other (non HMO) | Admitting: Family Medicine

## 2022-03-22 ENCOUNTER — Other Ambulatory Visit: Payer: Self-pay

## 2022-03-22 VITALS — BP 100/71 | HR 72 | Wt 211.2 lb

## 2022-03-22 DIAGNOSIS — Z01419 Encounter for gynecological examination (general) (routine) without abnormal findings: Secondary | ICD-10-CM

## 2022-03-22 DIAGNOSIS — Z0000 Encounter for general adult medical examination without abnormal findings: Secondary | ICD-10-CM | POA: Diagnosis present

## 2022-03-22 DIAGNOSIS — Z1211 Encounter for screening for malignant neoplasm of colon: Secondary | ICD-10-CM

## 2022-03-22 DIAGNOSIS — R5383 Other fatigue: Secondary | ICD-10-CM

## 2022-03-22 DIAGNOSIS — F32A Depression, unspecified: Secondary | ICD-10-CM | POA: Diagnosis not present

## 2022-03-22 NOTE — Progress Notes (Signed)
   GYNECOLOGY ANNUAL PREVENTATIVE CARE ENCOUNTER NOTE  Subjective:   Christine Watson is a 51 y.o. G3P0 female here for a routine annual gynecologic exam.  Current complaints: establishing care.     Menses are irregular periods - skipped this last month. Reports some perimenopausal sx- hot flashes occasionally.   Denies abnormal vaginal bleeding, discharge, pelvic pain, problems with intercourse or other gynecologic concerns.    Gynecologic History Patient's last menstrual period was 02/11/2022 (approximate). Contraception: tubal ligation Last Pap: 2022. Results were: abnormal Last mammogram: 02/15/22. Results were: normal  Health Maintenance Due  Topic Date Due   COVID-19 Vaccine (1) Never done   COLONOSCOPY (Pts 45-35yrs Insurance coverage will need to be confirmed)  Never done    The following portions of the patient's history were reviewed and updated as appropriate: allergies, current medications, past family history, past medical history, past social history, past surgical history and problem list.  Review of Systems Pertinent items are noted in HPI.   Objective:  BP 100/71   Pulse 72   Wt 211 lb 3.2 oz (95.8 kg)   LMP 02/11/2022 (Approximate)   BMI 42.66 kg/m  CONSTITUTIONAL: Well-developed, well-nourished female in no acute distress.  HENT:  Normocephalic, atraumatic, External right and left ear normal. Oropharynx is clear and moist EYES:  No scleral icterus.  NECK: Normal range of motion, supple, no masses.  Normal thyroid.  SKIN: Skin is warm and dry. No rash noted. Not diaphoretic. No erythema. No pallor. NEUROLOGIC: Alert and oriented to person, place, and time. Normal reflexes, muscle tone coordination. No cranial nerve deficit noted. PSYCHIATRIC: Normal mood and affect. Normal behavior. Normal judgment and thought content. CARDIOVASCULAR: Normal heart rate noted, regular rhythm. 2+ distal pulses. RESPIRATORY: Effort and breath sounds normal, no problems with  respiration noted. BREASTS: Symmetric in size. No masses, skin changes, nipple drainage, or lymphadenopathy. ABDOMEN: Soft,  no distention noted.  No tenderness, rebound or guarding.  PELVIC: Normal appearing external genitalia; normal appearing vaginal mucosa and cervix.  No abnormal discharge noted.  Pap smear obtained.  Normal uterine size, no other palpable masses, no uterine or adnexal tenderness. MUSCULOSKELETAL: Normal range of motion.     Assessment and Plan:  1) Annual gynecologic examination with pap smear:  Will follow up results of pap smear and manage accordingly. STI screening desired No.  Routine preventative health maintenance measures emphasized. Reviewed perimenopausal symptoms and management.  2) Contraception counseling: has had tubal ligation  1. Annual physical exam - Cytology - PAP( [place]) - TSH - FSH - Hemoglobin A1c  2. Other fatigue - TSH - FSH - Hemoglobin A1c  3. Well woman exam with routine gynecological exam - Cytology - PAP( [place]) - Ambulatory referral to Gastroenterology  4. Depression, unspecified depression type - Ambulatory referral to Integrated Behavioral Health  5. Colon cancer screening - Ambulatory referral to Gastroenterology   Please refer to After Visit Summary for other counseling recommendations.   Return in about 1 year (around 03/23/2023) for Yearly wellness exam.  Federico Flake, MD, MPH, ABFM Attending Physician [facility]

## 2022-03-23 LAB — HEMOGLOBIN A1C
Est. average glucose Bld gHb Est-mCnc: 103 mg/dL
Hgb A1c MFr Bld: 5.2 % (ref 4.8–5.6)

## 2022-03-23 LAB — TSH: TSH: 1.62 u[IU]/mL (ref 0.450–4.500)

## 2022-03-23 LAB — FOLLICLE STIMULATING HORMONE: FSH: 4.6 m[IU]/mL

## 2022-03-23 NOTE — BH Specialist Note (Signed)
Integrated Behavioral Health via Telemedicine Visit  03/29/2022 Berniece Abid [id number]  Number of Integrated Behavioral Health Clinician visits: 1- Initial Visit  Session Start time: 1422   Session End time: 1459  Total time in minutes: 37   Referring Provider: Federico Flake, MD Patient/Family location: [facility] Provider location: [facility]  All persons participating in visit: Patient Christine Watson and [facility] Meekah Math   Types of Service: Individual psychotherapy and Telephone visit  I connected with Marguerita Beards and/or Felix Ahmadi  n/a  via  Telephone or [employer]  (Video is Surveyor, mining) and verified that I am speaking with the correct person using two identifiers. Discussed confidentiality: Yes   I discussed the limitations of telemedicine and the availability of in person appointments.  Discussed there is a possibility of technology failure and discussed alternative modes of communication if that failure occurs.  I discussed that engaging in this telemedicine visit, they consent to the provision of behavioral healthcare and the services will be billed under their insurance.  Patient and/or legal guardian expressed understanding and consented to Telemedicine visit: Yes   Presenting Concerns: Patient and/or family reports the following symptoms/concerns: Mild mood fluctuations prior to periods, with increased crying and anxiousness, along with adjusting to other life stress; began working out and using massage chairs at local gym to help manage anticipated mood changes; open to implementing additional self-coping strategy on days unable to go to gym.  Duration of problem: Increasing  recent ; Severity of problem: moderate  Patient and/or Family's Strengths/Protective Factors: Social connections, Concrete supports in place (healthy food, safe  environments, etc.), Sense of purpose, and Physical Health (exercise, healthy diet, medication compliance, etc.)  Goals Addressed: Patient will:  Reduce symptoms of: mood instability   Increase knowledge and/or ability of: self-management skills   Demonstrate ability to: Increase healthy adjustment to current life circumstances  Progress towards Goals: Ongoing  Interventions: Interventions utilized:  Mindfulness or Management consultant and Psychoeducation and/or Health Education Standardized Assessments completed: Not Needed  Patient and/or Family Response: Patient agrees with treatment plan.   Assessment: Patient currently experiencing Adjustment disorder with other symptoms  Patient may benefit from psychoeducation and brief therapeutic interventions regarding coping with symptoms of mild mood instability .  Plan: Follow up with behavioral health clinician on : Call Corwin Kuiken at [phone], as needed. Behavioral recommendations:  -Continue plan to work out at gym daily after work, followed by massage chair -CALM relaxation breathing exercise twice daily (morning; at bedtime); as needed throughout the day, on days unable to attend gym  Referral(s): Integrated [employer] (In Clinic)  I discussed the assessment and treatment plan with the patient and/or parent/guardian. They were provided an opportunity to ask questions and all were answered. They agreed with the plan and demonstrated an understanding of the instructions.   They were advised to call back or seek an in-person evaluation if the symptoms worsen or if the condition fails to improve as anticipated.  Valetta Close Antar Milks, LCSW

## 2022-03-25 ENCOUNTER — Other Ambulatory Visit: Payer: Self-pay | Admitting: Family

## 2022-03-25 DIAGNOSIS — I10 Essential (primary) hypertension: Secondary | ICD-10-CM

## 2022-03-27 LAB — CYTOLOGY - PAP
Comment: NEGATIVE
Diagnosis: NEGATIVE
High risk HPV: NEGATIVE

## 2022-03-28 ENCOUNTER — Telehealth: Payer: Self-pay | Admitting: Lactation Services

## 2022-03-28 ENCOUNTER — Encounter: Payer: Self-pay | Admitting: Family Medicine

## 2022-03-28 NOTE — Telephone Encounter (Signed)
-----   Message from Federico Flake, MD sent at 03/27/2022  5:24 PM EDT ----- NIL, next pap 5 years

## 2022-03-28 NOTE — Telephone Encounter (Signed)
Called patient with Pap Smear Results. LM that results are in MyChart with message from provider.

## 2022-03-29 ENCOUNTER — Ambulatory Visit: Payer: Managed Care, Other (non HMO) | Admitting: Clinical

## 2022-03-29 DIAGNOSIS — F4329 Adjustment disorder with other symptoms: Secondary | ICD-10-CM | POA: Diagnosis not present

## 2022-03-29 NOTE — Patient Instructions (Signed)
[facility] [address] [place], [place] [address] [phone] (main office) [phone] (Judeen Geralds's office)   

## 2022-08-26 ENCOUNTER — Emergency Department (HOSPITAL_COMMUNITY)
Admission: EM | Admit: 2022-08-26 | Discharge: 2022-08-26 | Disposition: A | Discharge status: Home / Self Care | Payer: Managed Care, Other (non HMO) | Attending: Emergency Medicine | Admitting: Emergency Medicine

## 2022-08-26 ENCOUNTER — Other Ambulatory Visit: Payer: Self-pay

## 2022-08-26 DIAGNOSIS — W228XXA Striking against or struck by other objects, initial encounter: Secondary | ICD-10-CM | POA: Insufficient documentation

## 2022-08-26 DIAGNOSIS — S060X0A Concussion without loss of consciousness, initial encounter: Secondary | ICD-10-CM | POA: Insufficient documentation

## 2022-08-26 DIAGNOSIS — I10 Essential (primary) hypertension: Secondary | ICD-10-CM | POA: Insufficient documentation

## 2022-08-26 DIAGNOSIS — R519 Headache, unspecified: Secondary | ICD-10-CM | POA: Diagnosis present

## 2022-08-26 NOTE — ED Triage Notes (Signed)
Pt states she hit her head on a metal carport last week and has had headache since.

## 2022-08-27 NOTE — ED Provider Notes (Signed)
[facility] EMERGENCY DEPARTMENT Provider Note   CSN: [id number] Arrival date & time: 08/26/22  1818     History  Chief Complaint  Patient presents with   Headache    Christine Watson is a 51 y.o. female.  The history is provided by the patient.  Patient with history of hypertension presents with headache.  She reports she accidentally struck her forehead on a metal bar in a carport around December 5.  No LOC.  She did not fall.  Since that time she has had daily frontal headaches.  No fevers or vomiting.  She reports mild blurred vision ever since hitting her head.  No focal weakness.  She reports she had a concussion several months ago and is concerned about the proximity of the 2 concussions.  She is not on anticoagulation.  No cough or sore throat.     Home Medications Prior to Admission medications   Medication Sig Start Date End Date Taking? Authorizing Provider  acetaminophen (TYLENOL) 650 MG CR tablet Take 650 mg by mouth every 8 (eight) hours as needed for pain.    [provider]  lisinopril (ZESTRIL) 10 MG tablet Take 1 tablet (10 mg total) by mouth daily. 10/11/20   Rema Fendt, NP  Skin Oils (CASTOR OIL EX) Apply 1 application topically in the morning and at bedtime.    [provider]      Allergies    Patient has no known allergies.    Review of Systems   Review of Systems  Constitutional:  Negative for fever.  Neurological:  Positive for headaches. Negative for weakness.    Physical Exam Updated Vital Signs BP (!) 114/59   Pulse 82   Temp 97.7 F (36.5 C) (Oral)   Resp 20   Ht 1.499 m (4\' 11" )   Wt 97.5 kg   LMP 08/12/2022 (Approximate)   SpO2 97%   BMI 43.42 kg/m  Physical Exam CONSTITUTIONAL: Well developed/well nourished HEAD: Normocephalic/atraumatic, no bruising, swelling EYES: EOMI/PERRL, no nystagmus, no ptosis ENMT: Mucous membranes moist NECK: supple no meningeal signs SPINE/BACK:entire spine nontender CV:  S1/S2 noted, no murmurs/rubs/gallops noted LUNGS: Lungs are clear to auscultation bilaterally, no apparent distress ABDOMEN: soft, nontender, no rebound or guarding GU:no cva tenderness NEURO:Awake/alert, face symmetric, no arm or leg drift is noted Equal 5/5 strength with shoulder abduction, elbow flex/extension, wrist flex/extension in upper extremities and equal hand grips bilaterally Equal 5/5 strength with hip flexion,knee flex/extension, foot dorsi/plantar flexion Cranial nerves 3/4/5/6/03/18/09/11/12 tested and intact Gait normal without ataxia No past pointing Sensation to light touch intact in all extremities EXTREMITIES: pulses normal, full ROM SKIN: warm, color normal PSYCH: no abnormalities of mood noted, alert and oriented to situation  ED Results / Procedures / Treatments   Labs (all labs ordered are listed, but only abnormal results are displayed) Labs Reviewed - No data to display  EKG None  Radiology No results found.  Procedures Procedures    Medications Ordered in ED Medications - No data to display  ED Course/ Medical Decision Making/ A&P                           Medical Decision Making  This patient presents to the ED for concern of headache, this involves an extensive number of treatment options, and is a complaint that carries with it a high risk of complications and morbidity.  The differential diagnosis includes but is not limited to subarachnoid  hemorrhage, intracranial hemorrhage, meningitis, encephalitis, CVST, temporal arteritis, idiopathic intracranial hypertension, migraine    Comorbidities that complicate the patient evaluation: Patient's presentation is complicated by their history of hypertension   Test Considered: I considered neuroimaging, the patient had no LOC, her neuroexam is unremarkable, low suspicion for acute intracranial hemorrhage  Complexity of problems addressed: Patient's presentation is most consistent with  acute  complicated illness/injury requiring diagnostic workup  Disposition: After consideration of the diagnostic results and the patient's response to treatment,  I feel that the patent would benefit from discharge   .   Patient here for reassurance.  Exam is unremarkable.  No indication for imaging.  Will refer to neurology        Final Clinical Impression(s) / ED Diagnoses Final diagnoses:  Concussion without loss of consciousness, initial encounter    Rx / DC Orders ED Discharge Orders     None         Zadie Rhine, MD 08/27/22 0006

## 2022-09-25 ENCOUNTER — Ambulatory Visit: Payer: Managed Care, Other (non HMO) | Admitting: Family

## 2022-09-26 NOTE — Progress Notes (Deleted)
Patient ID: Christine Watson, female    DOB: [birth year]  MRN: [id number]  CC: Emergency Department Follow-Up  Subjective: Christine Watson is a 52 y.o. female who presents for emergency department follow-up.   Her concerns today include:   follow up from hospital visit   08/26/2022 [facility] [place] per MD note: Medical Decision Making   This patient presents to the ED for concern of headache, this involves an extensive number of treatment options, and is a complaint that carries with it a high risk of complications and morbidity.  The differential diagnosis includes but is not limited to subarachnoid hemorrhage, intracranial hemorrhage, meningitis, encephalitis, CVST, temporal arteritis, idiopathic intracranial hypertension, migraine     Comorbidities that complicate the patient evaluation: Patient's presentation is complicated by their history of hypertension     Test Considered: I considered neuroimaging, the patient had no LOC, her neuroexam is unremarkable, low suspicion for acute intracranial hemorrhage   Complexity of problems addressed: Patient's presentation is most consistent with  acute complicated illness/injury requiring diagnostic workup   Disposition: After consideration of the diagnostic results and the patient's response to treatment,  I feel that the patent would benefit from discharge   .    Patient here for reassurance.  Exam is unremarkable.  No indication for imaging.  Will refer to neurology   Follow-Ups: Follow up with Phillips Odor, MD (Neurology) in 2 weeks (09/09/2022); If symptoms worsen  Today's visit 09/27/2022: Neuro referral   Patient Active Problem List   Diagnosis Date Noted   Vitamin D deficiency 01/12/2021     Current Outpatient Medications on File Prior to Visit  Medication Sig Dispense Refill   acetaminophen (TYLENOL) 650 MG CR tablet Take 650 mg by mouth every 8 (eight) hours as needed for pain.     lisinopril  (ZESTRIL) 10 MG tablet Take 1 tablet (10 mg total) by mouth daily. 90 tablet 0   Skin Oils (CASTOR OIL EX) Apply 1 application topically in the morning and at bedtime.     No current facility-administered medications on file prior to visit.    No Known Allergies  Social History   Socioeconomic History   Marital status: Divorced    Spouse name: Not on file   Number of children: Not on file   Years of education: Not on file   Highest education level: Not on file  Occupational History   Not on file  Tobacco Use   Smoking status: Never   Smokeless tobacco: Never  Vaping Use   Vaping Use: Never used  Substance and Sexual Activity   Alcohol use: Not Currently   Drug use: Not Currently   Sexual activity: Not Currently    Birth control/protection: Surgical    Comment: tubal ligation  Other Topics Concern   Not on file  Social History Narrative   Not on file   Social Determinants of Health   Financial Resource Strain: Not on file  Food Insecurity: Not on file  Transportation Needs: No Transportation Needs (02/09/2021)   PRAPARE - Transportation    Lack of Transportation (Medical): No    Lack of Transportation (Non-Medical): No  Physical Activity: Not on file  Stress: Not on file  Social Connections: Not on file  Intimate Partner Violence: Not on file    Family History  Problem Relation Age of Onset   Hypertension Mother     Past Surgical History:  Procedure Laterality Date   APPENDECTOMY N/A    [facility]reesia 10/08/2020  CHOLECYSTECTOMY N/A    [employer]reesia 10/08/2020   LEEP  03/2020   TUBAL LIGATION N/A    [employer]reesia 10/08/2020    ROS: Review of Systems Negative except as stated above  PHYSICAL EXAM: There were no vitals taken for this visit.  Physical Exam  {female adult master:[id number]} {female adult master:[id number]}     Latest Ref Rng & Units 01/11/2021    4:47 PM 12/09/2020   10:25 AM 09/27/2020    2:40 PM  CMP  Glucose 70 - 99 mg/dL  105  95   BUN 6 - 20 mg/dL   14  9   Creatinine 0.44 - 1.00 mg/dL  0.78  0.81   Sodium 135 - 145 mmol/L  137  137   Potassium 3.5 - 5.1 mmol/L  4.0  4.8   Chloride 98 - 111 mmol/L  101  100   CO2 22 - 32 mmol/L  26  26   Calcium 8.9 - 10.3 mg/dL  9.4  9.7   Total Protein 6.0 - 8.5 g/dL 7.8   8.3   Total Bilirubin 0.0 - 1.2 mg/dL 0.5   0.9   Alkaline Phos 44 - 121 IU/L 90   77   AST 0 - 40 IU/L 19   22   ALT 0 - 32 IU/L 29   24    Lipid Panel     Component Value Date/Time   CHOL 201 (H) 01/11/2021 1647   TRIG 119 01/11/2021 1647   HDL 53 01/11/2021 1647   CHOLHDL 3.8 01/11/2021 1647   LDLCALC 127 (H) 01/11/2021 1647    CBC    Component Value Date/Time   WBC 7.5 12/09/2020 1025   RBC 4.72 12/09/2020 1025   HGB 14.4 12/09/2020 1025   HCT 43.6 12/09/2020 1025   PLT 326 12/09/2020 1025   MCV 92.4 12/09/2020 1025   MCH 30.5 12/09/2020 1025   MCHC 33.0 12/09/2020 1025   RDW 13.2 12/09/2020 1025   LYMPHSABS 2.7 12/09/2020 1025   MONOABS 0.6 12/09/2020 1025   EOSABS 0.1 12/09/2020 1025   BASOSABS 0.1 12/09/2020 1025    ASSESSMENT AND PLAN:  There are no diagnoses linked to this encounter.   Patient was given the opportunity to ask questions.  Patient verbalized understanding of the plan and was able to repeat key elements of the plan. Patient was given clear instructions to go to Emergency Department or return to medical center if symptoms don't improve, worsen, or new problems develop.The patient verbalized understanding.   No orders of the defined types were placed in this encounter.    Requested Prescriptions    No prescriptions requested or ordered in this encounter    No follow-ups on file.  Camillia Herter, NP

## 2022-09-27 ENCOUNTER — Ambulatory Visit: Payer: Managed Care, Other (non HMO) | Admitting: Family

## 2022-10-05 NOTE — Progress Notes (Unsigned)
Patient ID: Anylah Scheib, female    DOB: 12-06-1970  MRN: [id number]  CC: Emergency Department Follow-Up  Subjective: Christine Watson is a 52 y.o. female who presents for emergency department follow-up.   Her concerns today include:  08/26/2022 [facility] EMERGENCY DEPARTMENT per MD note: Medical Decision Making   This patient presents to the ED for concern of headache, this involves an extensive number of treatment options, and is a complaint that carries with it a high risk of complications and morbidity.  The differential diagnosis includes but is not limited to subarachnoid hemorrhage, intracranial hemorrhage, meningitis, encephalitis, CVST, temporal arteritis, idiopathic intracranial hypertension, migraine     Comorbidities that complicate the patient evaluation: Patient's presentation is complicated by their history of hypertension     Test Considered: I considered neuroimaging, the patient had no LOC, her neuroexam is unremarkable, low suspicion for acute intracranial hemorrhage   Complexity of problems addressed: Patient's presentation is most consistent with  acute complicated illness/injury requiring diagnostic workup   Disposition: After consideration of the diagnostic results and the patient's response to treatment,  I feel that the patent would benefit from discharge   .    Patient here for reassurance.  Exam is unremarkable.  No indication for imaging.  Will refer to neurology   Follow-Ups: Follow up with Phillips Odor, MD (Neurology) in 2 weeks (09/09/2022); If symptoms worsen  Today's visit 09/27/2022: - Patient reports since emergency department discharge feeling improved and back to baseline. She is no longer having headaches. Denies red flag symptoms.  - Reports intermittent right lower quadrant pain x 2 years. Notices especially with sudden movements. Denies red flag symptoms.   Patient Active Problem List   Diagnosis Date Noted   Vitamin D  deficiency 01/12/2021     Current Outpatient Medications on File Prior to Visit  Medication Sig Dispense Refill   acetaminophen (TYLENOL) 650 MG CR tablet Take 650 mg by mouth every 8 (eight) hours as needed for pain.     Biotin 1 MG CAPS Take by mouth.     cholecalciferol (VITAMIN D3) 25 MCG (1000 UNIT) tablet Take 1,000 Units by mouth daily.     lisinopril (ZESTRIL) 10 MG tablet Take 1 tablet (10 mg total) by mouth daily. 90 tablet 0   Omega-3 Fatty Acids (FISH OIL) 300 MG CAPS Take by mouth.     Skin Oils (CASTOR OIL EX) Apply 1 application topically in the morning and at bedtime.     No current facility-administered medications on file prior to visit.    No Known Allergies  Social History   Socioeconomic History   Marital status: Divorced    Spouse name: Not on file   Number of children: Not on file   Years of education: Not on file   Highest education level: Not on file  Occupational History   Not on file  Tobacco Use   Smoking status: Never   Smokeless tobacco: Never  Vaping Use   Vaping Use: Never used  Substance and Sexual Activity   Alcohol use: Not Currently   Drug use: Not Currently   Sexual activity: Not Currently    Birth control/protection: Surgical    Comment: tubal ligation  Other Topics Concern   Not on file  Social History Narrative   Not on file   Social Determinants of Health   Financial Resource Strain: Not on file  Food Insecurity: Not on file  Transportation Needs: No Transportation Needs (02/09/2021)   PRAPARE -  Hydrologist (Medical): No    Lack of Transportation (Non-Medical): No  Physical Activity: Not on file  Stress: Not on file  Social Connections: Not on file  Intimate Partner Violence: Not on file    Family History  Problem Relation Age of Onset   Hypertension Mother     Past Surgical History:  Procedure Laterality Date   APPENDECTOMY N/A    [facility]reesia 10/08/2020   CHOLECYSTECTOMY N/A     [facility]reesia 10/08/2020   LEEP  03/2020   TUBAL LIGATION N/A    [facility]reesia 10/08/2020    ROS: Review of Systems Negative except as stated above  PHYSICAL EXAM: BP 117/61 (BP Location: Left Arm, Patient Position: Sitting, Cuff Size: Large)   Pulse 77   Resp 16   Ht 4\' 11"  (1.499 m)   Wt 231 lb (104.8 kg)   LMP 08/19/2022   SpO2 94%   BMI 46.66 kg/m   Physical Exam HENT:     Head: Normocephalic and atraumatic.     Nose: Nose normal.     Mouth/Throat:     Mouth: Mucous membranes are moist.     Pharynx: Oropharynx is clear.  Eyes:     Extraocular Movements: Extraocular movements intact.     Conjunctiva/sclera: Conjunctivae normal.     Pupils: Pupils are equal, round, and reactive to light.  Cardiovascular:     Rate and Rhythm: Normal rate and regular rhythm.     Pulses: Normal pulses.     Heart sounds: Normal heart sounds.  Pulmonary:     Effort: Pulmonary effort is normal.     Breath sounds: Normal breath sounds.  Abdominal:     General: Bowel sounds are normal.     Palpations: Abdomen is soft.  Musculoskeletal:     Cervical back: Normal range of motion and neck supple.  Neurological:     General: No focal deficit present.     Mental Status: She is alert and oriented to person, place, and time.  Psychiatric:        Mood and Affect: Mood normal.        Behavior: Behavior normal.     ASSESSMENT AND PLAN: 1. Concussion without loss of consciousness, subsequent encounter - Patient reports since emergency department visit feeling improved and back to baseline.  - Patient declined referral to Neurology  - Follow-up with primary provider as scheduled.   2. Chronic abdominal pain - Routine screening.  - Referral to Gastroenterology for further evaluation/management.  - CMP14+EGFR - CBC - Lipase - Amylase - POCT URINALYSIS DIP (CLINITEK) - Cervicovaginal ancillary only - POCT urine pregnancy - Ambulatory referral to Gastroenterology  3. Colon cancer screening -  Referral to Gastroenterology for further evaluation/management.  - Ambulatory referral to Gastroenterology   Patient was given the opportunity to ask questions.  Patient verbalized understanding of the plan and was able to repeat key elements of the plan. Patient was given clear instructions to go to Emergency Department or return to medical center if symptoms don't improve, worsen, or new problems develop.The patient verbalized understanding.   Orders Placed This Encounter  Procedures   CMP14+EGFR   CBC   Lipase   Amylase   Ambulatory referral to Gastroenterology   POCT URINALYSIS DIP (CLINITEK)   POCT urine pregnancy   Follow-up with primary provider as scheduled.  Camillia Herter, NP

## 2022-10-08 ENCOUNTER — Other Ambulatory Visit (HOSPITAL_COMMUNITY)
Admission: RE | Admit: 2022-10-08 | Discharge: 2022-10-08 | Disposition: A | Discharge status: Home / Self Care | Payer: Managed Care, Other (non HMO) | Source: Ambulatory Visit | Attending: Family | Admitting: Family

## 2022-10-08 ENCOUNTER — Encounter: Payer: Self-pay | Admitting: Family

## 2022-10-08 ENCOUNTER — Ambulatory Visit (INDEPENDENT_AMBULATORY_CARE_PROVIDER_SITE_OTHER): Payer: Managed Care, Other (non HMO) | Admitting: Family

## 2022-10-08 VITALS — BP 117/61 | HR 77 | Resp 16 | Ht 59.0 in | Wt 231.0 lb

## 2022-10-08 DIAGNOSIS — Z3202 Encounter for pregnancy test, result negative: Secondary | ICD-10-CM | POA: Diagnosis not present

## 2022-10-08 DIAGNOSIS — E6609 Other obesity due to excess calories: Secondary | ICD-10-CM | POA: Diagnosis not present

## 2022-10-08 DIAGNOSIS — Z6841 Body Mass Index (BMI) 40.0 and over, adult: Secondary | ICD-10-CM

## 2022-10-08 DIAGNOSIS — R109 Unspecified abdominal pain: Secondary | ICD-10-CM | POA: Diagnosis present

## 2022-10-08 DIAGNOSIS — S060X0D Concussion without loss of consciousness, subsequent encounter: Secondary | ICD-10-CM | POA: Diagnosis not present

## 2022-10-08 DIAGNOSIS — Z1211 Encounter for screening for malignant neoplasm of colon: Secondary | ICD-10-CM

## 2022-10-08 DIAGNOSIS — G8929 Other chronic pain: Secondary | ICD-10-CM

## 2022-10-08 LAB — POCT URINALYSIS DIP (CLINITEK)
Bilirubin, UA: NEGATIVE
Glucose, UA: NEGATIVE mg/dL
Ketones, POC UA: NEGATIVE mg/dL
Leukocytes, UA: NEGATIVE
Nitrite, UA: NEGATIVE
Spec Grav, UA: >=1.03 — AB (ref 1.010–1.025)
Urobilinogen, UA: 0.2 E.U./dL
pH, UA: 5.5 (ref 5.0–8.0)

## 2022-10-08 LAB — POCT URINE PREGNANCY: Preg Test, Ur: NEGATIVE

## 2022-10-08 NOTE — Addendum Note (Signed)
Addended by: Carilyn Goodpasture on: 10/08/2022 04:37 PM   Modules accepted: Orders

## 2022-10-08 NOTE — Progress Notes (Signed)
Neurological concerns after concussion  Intermittent RLQ discomfort for several years  Colonoscopy scheduled

## 2022-10-09 ENCOUNTER — Other Ambulatory Visit: Payer: Self-pay | Admitting: Family

## 2022-10-09 ENCOUNTER — Telehealth: Payer: Self-pay | Admitting: Family

## 2022-10-09 ENCOUNTER — Encounter: Payer: Self-pay | Admitting: Gastroenterology

## 2022-10-09 DIAGNOSIS — I10 Essential (primary) hypertension: Secondary | ICD-10-CM

## 2022-10-09 LAB — CMP14+EGFR
ALT: 21 IU/L (ref 0–32)
AST: 17 IU/L (ref 0–40)
Albumin/Globulin Ratio: 1.5 (ref 1.2–2.2)
Albumin: 4.5 g/dL (ref 3.9–4.9)
Alkaline Phosphatase: 71 IU/L (ref 44–121)
BUN/Creatinine Ratio: 24 — ABNORMAL HIGH (ref 9–23)
BUN: 15 mg/dL (ref 6–24)
Bilirubin Total: 0.4 mg/dL (ref 0.0–1.2)
CO2: 21 mmol/L (ref 20–29)
Calcium: 9.9 mg/dL (ref 8.7–10.2)
Chloride: 102 mmol/L (ref 96–106)
Creatinine, Ser: 0.63 mg/dL (ref 0.57–1.00)
Globulin, Total: 3 g/dL (ref 1.5–4.5)
Glucose: 86 mg/dL (ref 70–99)
Potassium: 4.2 mmol/L (ref 3.5–5.2)
Sodium: 139 mmol/L (ref 134–144)
Total Protein: 7.5 g/dL (ref 6.0–8.5)
eGFR: 108 mL/min/{1.73_m2} (ref >59)

## 2022-10-09 LAB — CBC
Hematocrit: 40.4 % (ref 34.0–46.6)
Hemoglobin: 13.6 g/dL (ref 11.1–15.9)
MCH: 29.2 pg (ref 26.6–33.0)
MCHC: 33.7 g/dL (ref 31.5–35.7)
MCV: 87 fL (ref 79–97)
Platelets: 329 10*3/uL (ref 150–450)
RBC: 4.66 x10E6/uL (ref 3.77–5.28)
RDW: 13.5 % (ref 11.7–15.4)
WBC: 6.3 10*3/uL (ref 3.4–10.8)

## 2022-10-09 LAB — CERVICOVAGINAL ANCILLARY ONLY
Bacterial Vaginitis (gardnerella): POSITIVE — AB
Candida Glabrata: NEGATIVE
Candida Vaginitis: NEGATIVE
Chlamydia: NEGATIVE
Comment: NEGATIVE
Comment: NEGATIVE
Comment: NEGATIVE
Comment: NEGATIVE
Comment: NEGATIVE
Comment: NORMAL
Neisseria Gonorrhea: NEGATIVE
Trichomonas: NEGATIVE

## 2022-10-09 LAB — LIPASE: Lipase: 40 U/L (ref 14–72)

## 2022-10-09 LAB — AMYLASE: Amylase: 43 U/L (ref 31–110)

## 2022-10-09 MED ORDER — LISINOPRIL 10 MG PO TABS: 10.0000 mg | ORAL_TABLET | Freq: Every day | ORAL | 2 refills | Status: DC

## 2022-10-10 ENCOUNTER — Other Ambulatory Visit: Payer: Self-pay | Admitting: Family

## 2022-10-10 DIAGNOSIS — N760 Acute vaginitis: Secondary | ICD-10-CM | POA: Insufficient documentation

## 2022-10-10 DIAGNOSIS — B9689 Other specified bacterial agents as the cause of diseases classified elsewhere: Secondary | ICD-10-CM | POA: Insufficient documentation

## 2022-10-10 MED ORDER — METRONIDAZOLE 500 MG PO TABS: 500.0000 mg | ORAL_TABLET | Freq: Two times a day (BID) | ORAL | 0 refills | Status: AC

## 2022-10-11 NOTE — Telephone Encounter (Signed)
Left message on voicemail to return call to schedule an appt to return for labs in 3 mos per PCP.

## 2022-10-23 ENCOUNTER — Ambulatory Visit: Payer: Self-pay | Admitting: *Deleted

## 2022-10-23 NOTE — Telephone Encounter (Signed)
  Chief Complaint: Knee Pain Symptoms: Left knee with 9/10 constant pain "Like needles, burning."  States may have injured 1 week ago at gym. Left calf was painful initially, now knee.  "Can barely walk, bear weight." States has applied ice/heat, tylenol, IBU, all ineffective.  Frequency: Friday, increased yesterday Pertinent Negatives: Patient denies swelling, redness, warmth Disposition: [] ED /[x] Urgent Care (no appt availability in office) / [] Appointment(In office/virtual)/ []  [place] [facility]/ [] Home Care/ [] Refused Recommended Disposition /[] [place]/ []  Follow-up with PCP Additional Notes: No availability at practice, advised UC. States will follow disposition.  Reason for Disposition  [1] SEVERE pain (e.g., excruciating, unable to walk) AND [2] not improved after 2 hours of pain medicine  Answer Assessment - Initial Assessment Questions 1. LOCATION and RADIATION: "Where is the pain located?"      Left knee 2. QUALITY: "What does the pain feel like?"  (e.g., sharp, dull, aching, burning)     Needles, burning. 3. SEVERITY: "How bad is the pain?" "What does it keep you from doing?"   (Scale 1-10; or mild, moderate, severe)   -  MILD (1-3): doesn't interfere with normal activities    -  MODERATE (4-7): interferes with normal activities (e.g., work or school) or awakens from sleep, limping    -  SEVERE (8-10): excruciating pain, unable to do any normal activities, unable to walk     9/10, constant 4. ONSET: "When did the pain start?" "Does it come and go, or is it there all the time?"     Left calf Friday 5. RECURRENT: "Have you had this pain before?" If Yes, ask: "When, and what happened then?"     No 6. SETTING: "Has there been any recent work, exercise or other activity that involved that part of the body?"      Exercise 1 week ago 7. AGGRAVATING FACTORS: "What makes the knee pain worse?" (e.g., walking, climbing stairs, running)     Walking, hard to bear  weight 8. ASSOCIATED SYMPTOMS: "Is there any swelling or redness of the knee?"     No 9. OTHER SYMPTOMS: "Do you have any other symptoms?" (e.g., chest pain, difficulty breathing, fever, calf pain)     No  Protocols used: Knee Pain-A-AH

## 2022-10-29 NOTE — Telephone Encounter (Signed)
Patient scheduled 02/20 @ 8:30am

## 2022-10-30 ENCOUNTER — Other Ambulatory Visit: Payer: Managed Care, Other (non HMO)

## 2022-10-30 ENCOUNTER — Encounter: Payer: Self-pay | Admitting: Family

## 2022-11-06 NOTE — Progress Notes (Deleted)
GI Office Note    Referring Provider: Camillia Herter, NP Primary Care Physician:  Camillia Herter, NP  Primary Gastroenterologist:  Chief Complaint   No chief complaint on file.    History of Present Illness   Christine Watson is a 52 y.o. female presenting today at the request of Durene Fruits, NP for further evaluation of chronic abdominal pain, colon cancer screening.        Medications   Current Outpatient Medications  Medication Sig Dispense Refill   acetaminophen (TYLENOL) 650 MG CR tablet Take 650 mg by mouth every 8 (eight) hours as needed for pain.     Biotin 1 MG CAPS Take by mouth.     cholecalciferol (VITAMIN D3) 25 MCG (1000 UNIT) tablet Take 1,000 Units by mouth daily.     lisinopril (ZESTRIL) 10 MG tablet Take 1 tablet (10 mg total) by mouth daily. 30 tablet 2   Omega-3 Fatty Acids (FISH OIL) 300 MG CAPS Take by mouth.     Skin Oils (CASTOR OIL EX) Apply 1 application topically in the morning and at bedtime.     No current facility-administered medications for this visit.    Allergies   Allergies as of 11/07/2022   (No Known Allergies)    Past Medical History   Past Medical History:  Diagnosis Date   Hyperlipidemia    [facility]reesia 10/08/2020   Hypertension    [facility]reesia 10/08/2020    Past Surgical History   Past Surgical History:  Procedure Laterality Date   APPENDECTOMY N/A    [facility]reesia 10/08/2020   CHOLECYSTECTOMY N/A    [facility]reesia 10/08/2020   LEEP  03/2020   TUBAL LIGATION N/A    [facility]reesia 10/08/2020    Past Family History   Family History  Problem Relation Age of Onset   Hypertension Mother     Past Social History   Social History   Socioeconomic History   Marital status: Divorced    Spouse name: Not on file   Number of children: Not on file   Years of education: Not on file   Highest education level: Not on file  Occupational History   Not on file  Tobacco Use   Smoking status: Never   Smokeless tobacco: Never   Vaping Use   Vaping Use: Never used  Substance and Sexual Activity   Alcohol use: Not Currently   Drug use: Not Currently   Sexual activity: Not Currently    Birth control/protection: Surgical    Comment: tubal ligation  Other Topics Concern   Not on file  Social History Narrative   Not on file   Social Determinants of Health   Financial Resource Strain: Not on file  Food Insecurity: Not on file  Transportation Needs: No Transportation Needs (02/09/2021)   PRAPARE - Hydrologist (Medical): No    Lack of Transportation (Non-Medical): No  Physical Activity: Not on file  Stress: Not on file  Social Connections: Not on file  Intimate Partner Violence: Not on file    Review of Systems   General: Negative for anorexia, weight loss, fever, chills, fatigue, weakness. Eyes: Negative for vision changes.  ENT: Negative for hoarseness, difficulty swallowing , nasal congestion. CV: Negative for chest pain, angina, palpitations, dyspnea on exertion, peripheral edema.  Respiratory: Negative for dyspnea at rest, dyspnea on exertion, cough, sputum, wheezing.  GI: See history of present illness. GU:  Negative for dysuria, hematuria, urinary incontinence, urinary frequency, nocturnal  urination.  MS: Negative for joint pain, low back pain.  Derm: Negative for rash or itching.  Neuro: Negative for weakness, abnormal sensation, seizure, frequent headaches, memory loss,  confusion.  Psych: Negative for anxiety, depression, suicidal ideation, hallucinations.  Endo: Negative for unusual weight change.  Heme: Negative for bruising or bleeding. Allergy: Negative for rash or hives.  Physical Exam   LMP 08/19/2022    General: Well-nourished, well-developed in no acute distress.  Head: Normocephalic, atraumatic.   Eyes: Conjunctiva pink, no icterus. Mouth: Oropharyngeal mucosa moist and pink , no lesions erythema or exudate. Neck: Supple without thyromegaly, masses,  or lymphadenopathy.  Lungs: Clear to auscultation bilaterally.  Heart: Regular rate and rhythm, no murmurs rubs or gallops.  Abdomen: Bowel sounds are normal, nontender, nondistended, no hepatosplenomegaly or masses,  no abdominal bruits or hernia, no rebound or guarding.   Rectal: *** Extremities: No lower extremity edema. No clubbing or deformities.  Neuro: Alert and oriented x 4 , grossly normal neurologically.  Skin: Warm and dry, no rash or jaundice.   Psych: Alert and cooperative, normal mood and affect.  Labs   Lab Results  Component Value Date   LIPASE 40 10/08/2022   Lab Results  Component Value Date   CREATININE 0.63 10/08/2022   BUN 15 10/08/2022   NA 139 10/08/2022   K 4.2 10/08/2022   CL 102 10/08/2022   CO2 21 10/08/2022   Lab Results  Component Value Date   ALT 21 10/08/2022   AST 17 10/08/2022   ALKPHOS 71 10/08/2022   BILITOT 0.4 10/08/2022   Lab Results  Component Value Date   WBC 6.3 10/08/2022   HGB 13.6 10/08/2022   HCT 40.4 10/08/2022   MCV 87 10/08/2022   PLT 329 10/08/2022    Imaging Studies   No results found.  Assessment       PLAN   ***   Laureen Ochs. Bobby Rumpf, [place], [facility]

## 2022-11-07 ENCOUNTER — Ambulatory Visit: Payer: Managed Care, Other (non HMO) | Admitting: Gastroenterology

## 2023-02-08 ENCOUNTER — Other Ambulatory Visit: Payer: Self-pay | Admitting: Family

## 2023-02-08 DIAGNOSIS — I10 Essential (primary) hypertension: Secondary | ICD-10-CM

## 2023-02-10 IMAGING — MG MM DIGITAL SCREENING BILAT W/ TOMO AND CAD
8 series · 8 of 24 positions shown · non-contrast
Comparison: Previous exam(s).

CLINICAL DATA: Screening.

EXAM:
DIGITAL SCREENING BILATERAL MAMMOGRAM WITH TOMOSYNTHESIS AND CAD
TECHNIQUE: Bilateral screening digital craniocaudal and mediolateral oblique
mammograms were obtained. Bilateral screening digital breast
tomosynthesis was performed. The images were evaluated with
computer-aided detection.

[R CC synth-2D]
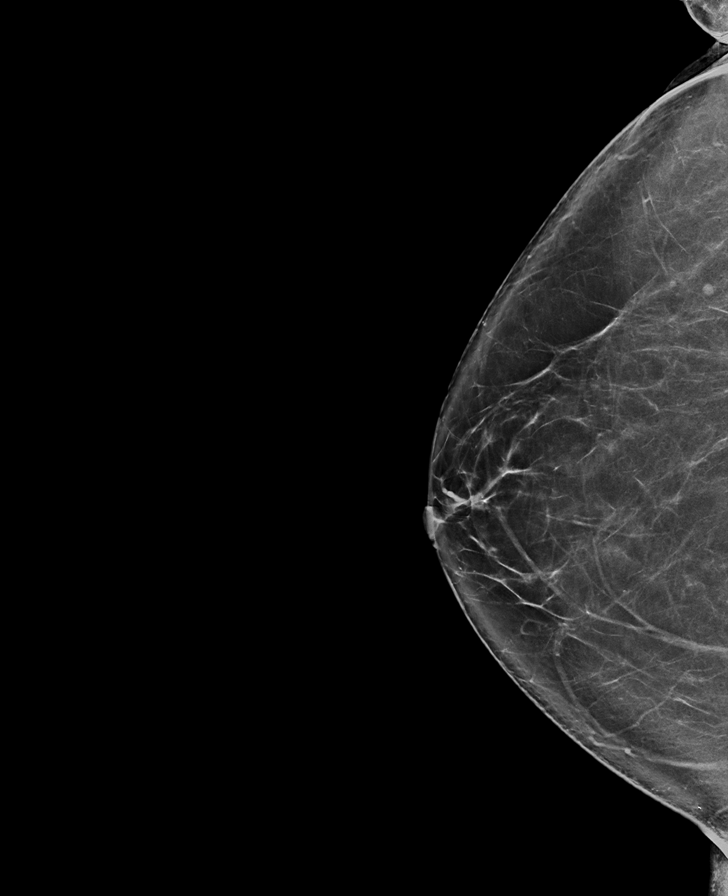

[R MLO synth-2D]
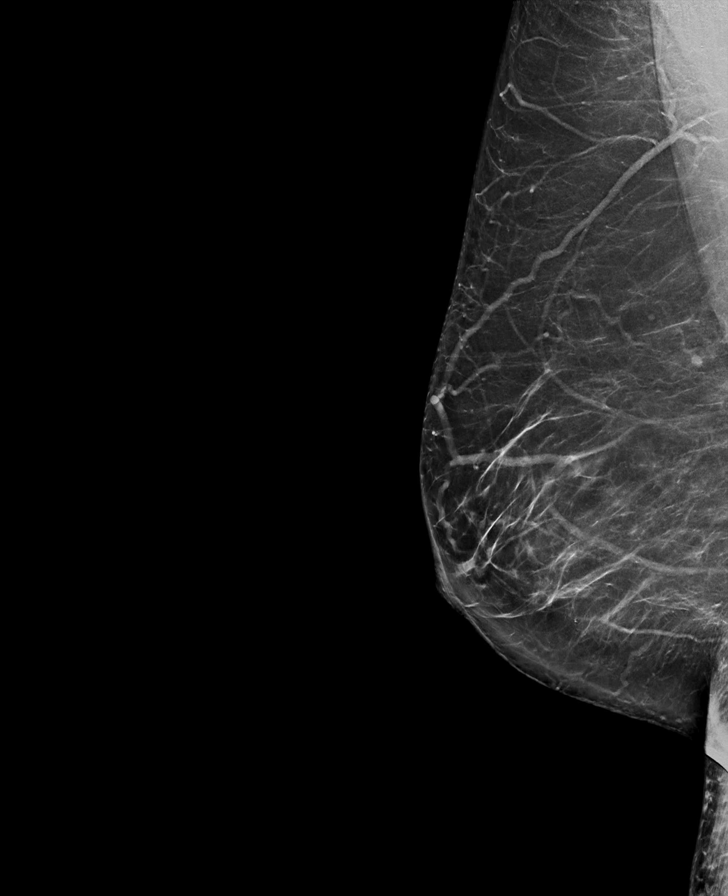

[L MLO synth-2D]
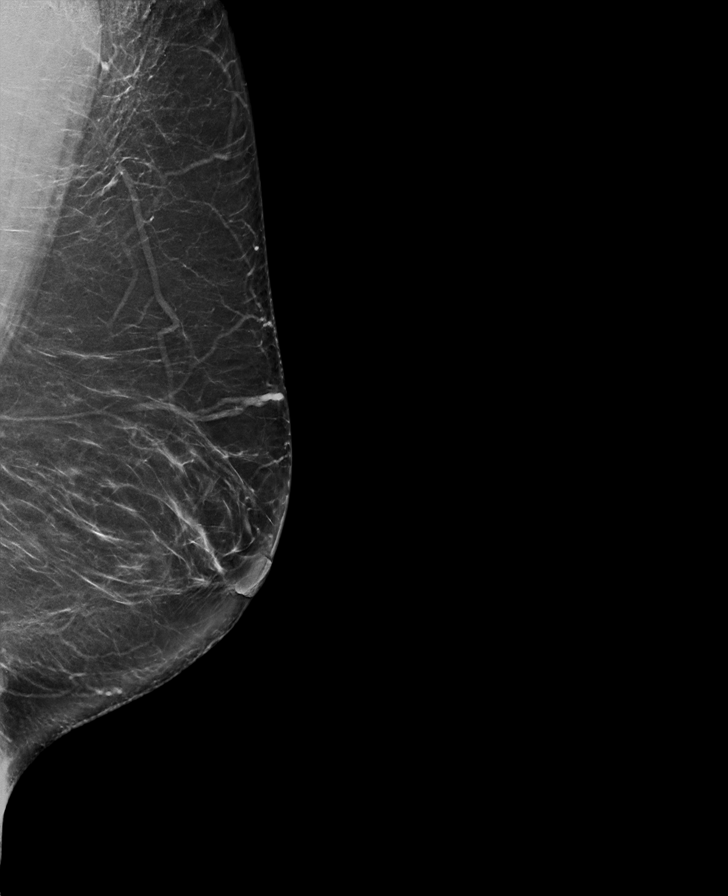

[L CC synth-2D]
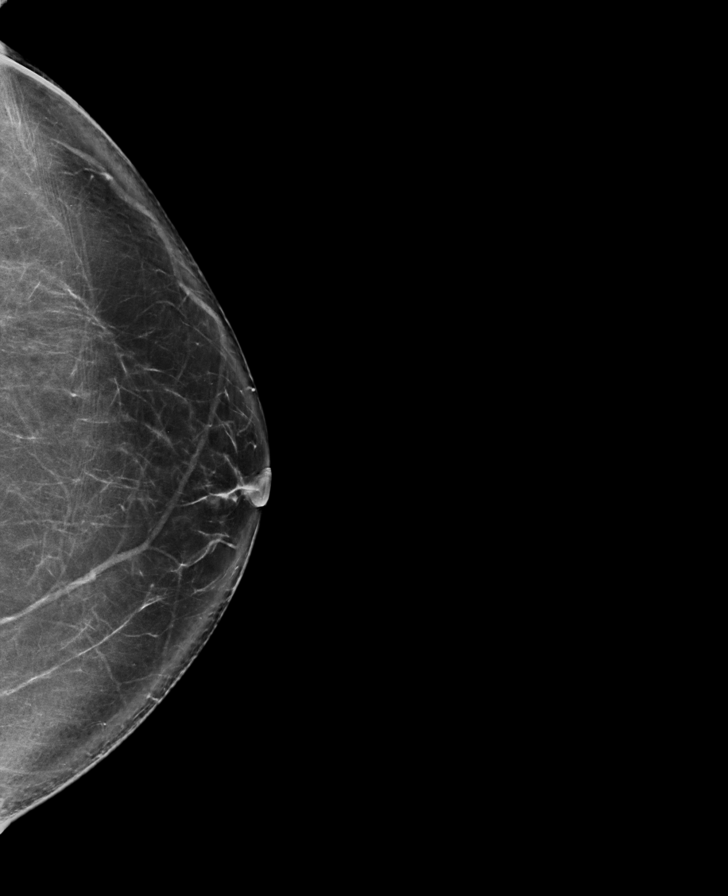

[R CC tomo · tomo slice 41/80.0]
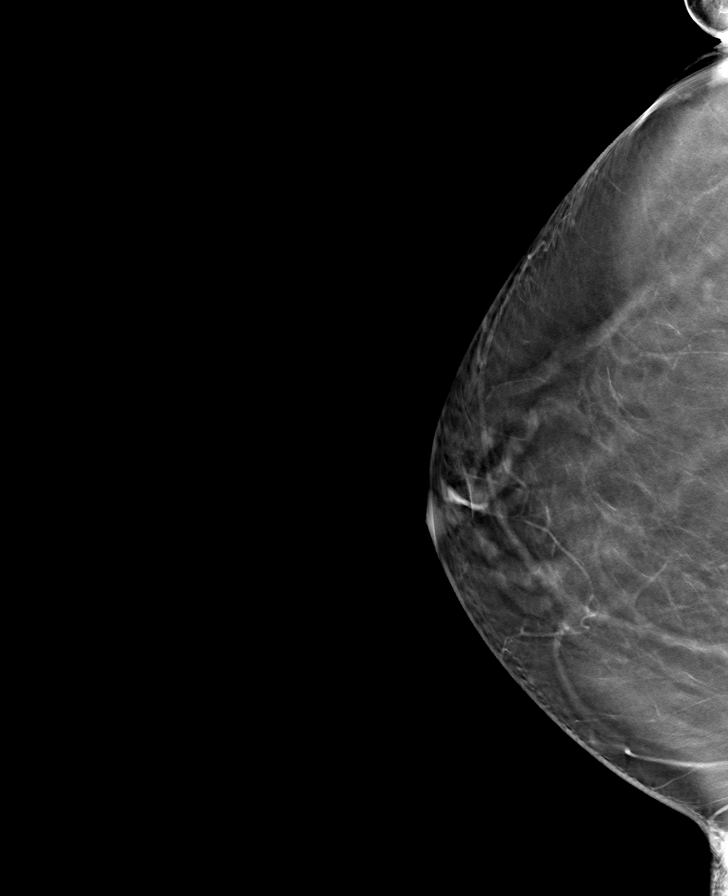

[R MLO tomo · tomo slice 42/83.0]
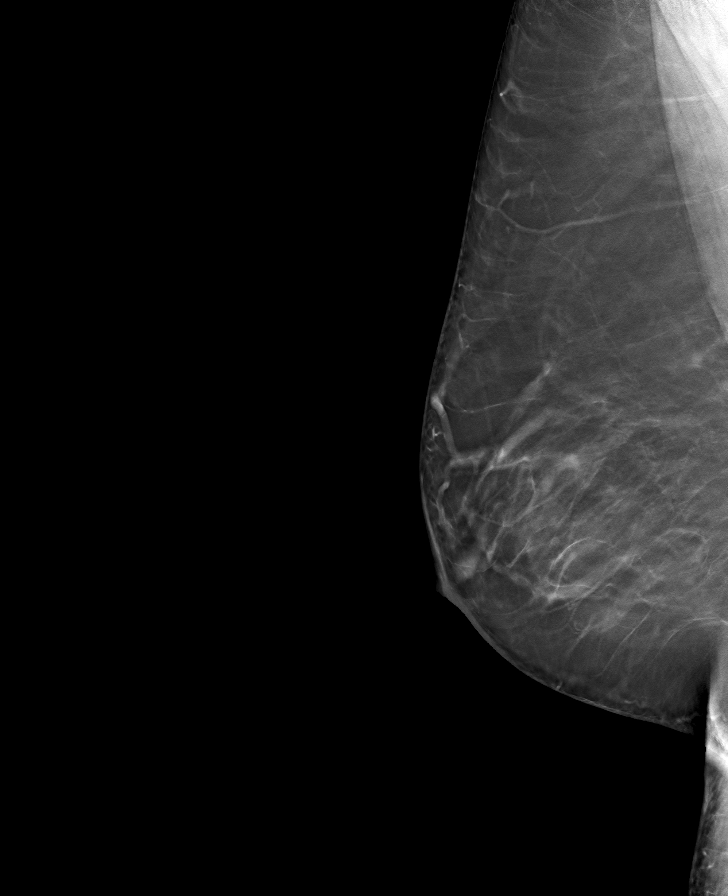

[L CC tomo · tomo slice 42/83.0]
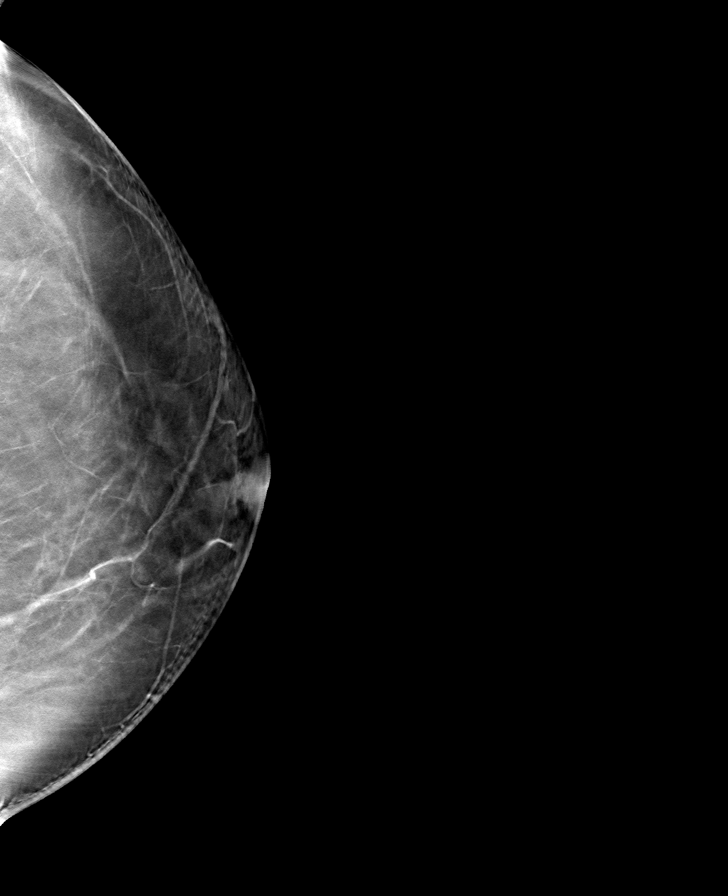

[L MLO tomo · tomo slice 41/82.0]
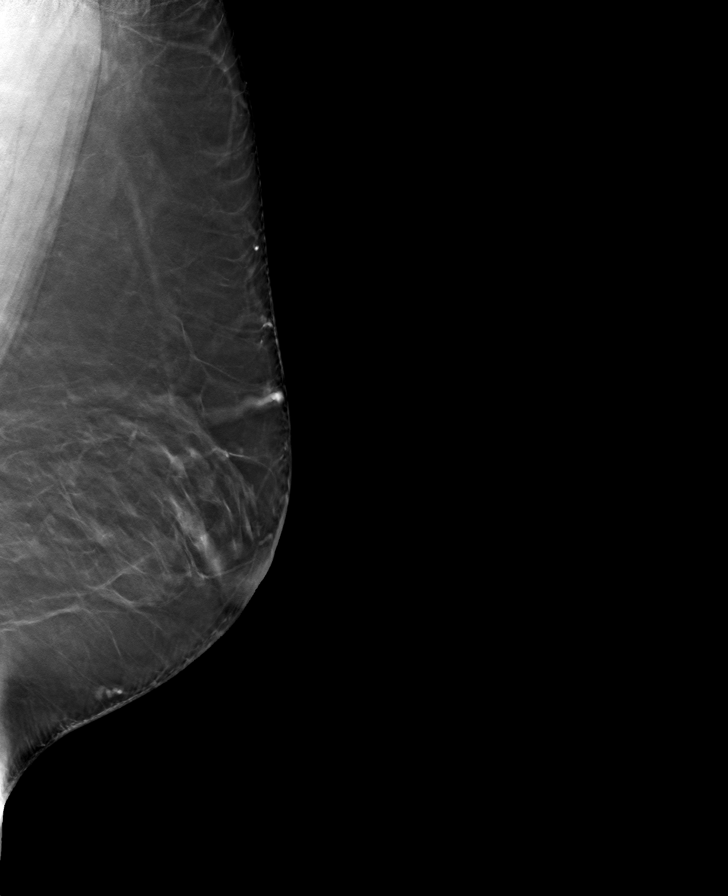

[8 of 24 positions shown; findings below may reference images not displayed]

ACR Breast Density Category b: There are scattered areas of
fibroglandular density.
FINDINGS: There are no findings suspicious for malignancy.
IMPRESSION: No mammographic evidence of malignancy. A result letter of this
screening mammogram will be mailed directly to the patient.

RECOMMENDATION:
Screening mammogram in one year. (Code:51-O-LD2)

BI-RADS CATEGORY  1: Negative.

## 2023-02-11 MED ORDER — LISINOPRIL 10 MG PO TABS: 10.0000 mg | ORAL_TABLET | Freq: Every day | ORAL | 2 refills | Status: DC

## 2023-06-16 ENCOUNTER — Other Ambulatory Visit: Payer: Self-pay | Admitting: Family

## 2023-06-16 DIAGNOSIS — I10 Essential (primary) hypertension: Secondary | ICD-10-CM

## 2023-07-01 ENCOUNTER — Encounter: Payer: Self-pay | Admitting: Family

## 2023-07-01 ENCOUNTER — Ambulatory Visit (INDEPENDENT_AMBULATORY_CARE_PROVIDER_SITE_OTHER): Payer: Managed Care, Other (non HMO) | Admitting: Family

## 2023-07-01 ENCOUNTER — Other Ambulatory Visit (HOSPITAL_COMMUNITY)
Admission: RE | Admit: 2023-07-01 | Discharge: 2023-07-01 | Disposition: A | Discharge status: Home / Self Care | Payer: Managed Care, Other (non HMO) | Source: Ambulatory Visit | Attending: Family | Admitting: Family

## 2023-07-01 VITALS — HR 64 | Temp 98.1°F | Ht 59.0 in | Wt 212.2 lb

## 2023-07-01 DIAGNOSIS — R109 Unspecified abdominal pain: Secondary | ICD-10-CM | POA: Diagnosis not present

## 2023-07-01 DIAGNOSIS — I10 Essential (primary) hypertension: Secondary | ICD-10-CM | POA: Diagnosis not present

## 2023-07-01 DIAGNOSIS — Z3202 Encounter for pregnancy test, result negative: Secondary | ICD-10-CM | POA: Diagnosis not present

## 2023-07-01 DIAGNOSIS — R5383 Other fatigue: Secondary | ICD-10-CM | POA: Diagnosis not present

## 2023-07-01 DIAGNOSIS — E6609 Other obesity due to excess calories: Secondary | ICD-10-CM

## 2023-07-01 DIAGNOSIS — Z114 Encounter for screening for human immunodeficiency virus [HIV]: Secondary | ICD-10-CM

## 2023-07-01 DIAGNOSIS — Z113 Encounter for screening for infections with a predominantly sexual mode of transmission: Secondary | ICD-10-CM | POA: Insufficient documentation

## 2023-07-01 DIAGNOSIS — Z6841 Body Mass Index (BMI) 40.0 and over, adult: Secondary | ICD-10-CM

## 2023-07-01 DIAGNOSIS — Z1211 Encounter for screening for malignant neoplasm of colon: Secondary | ICD-10-CM

## 2023-07-01 DIAGNOSIS — G8929 Other chronic pain: Secondary | ICD-10-CM | POA: Diagnosis present

## 2023-07-01 DIAGNOSIS — E66812 Obesity, class 2: Secondary | ICD-10-CM

## 2023-07-01 LAB — POCT URINALYSIS DIP (CLINITEK)
Bilirubin, UA: NEGATIVE
Glucose, UA: NEGATIVE mg/dL
Ketones, POC UA: NEGATIVE mg/dL
Leukocytes, UA: NEGATIVE
Nitrite, UA: NEGATIVE
POC PROTEIN,UA: NEGATIVE
Spec Grav, UA: 1.025 (ref 1.010–1.025)
Urobilinogen, UA: 0.2 U/dL
pH, UA: 7 (ref 5.0–8.0)

## 2023-07-01 LAB — POCT URINE PREGNANCY: Preg Test, Ur: NEGATIVE

## 2023-07-01 MED ORDER — LISINOPRIL 10 MG PO TABS: 10.0000 mg | ORAL_TABLET | Freq: Every day | ORAL | 0 refills | Status: DC

## 2023-07-01 NOTE — Progress Notes (Signed)
Patient ID: Christine Watson, female    DOB: [birth year]  MRN: [id number]  CC: Chronic Conditions Follow-Up  Subjective: Vedhika Cardarelli is a 52 y.o. female who presents for chronic conditions follow-up.  Her concerns today include:  - Doing well on Lisinopril, no issues/concerns. She does not complain of red flag symptoms such as but not limited to chest pain, shortness of breath, worst headache of life, nausea/vomiting.  - Left upper stomach pain persisting. Sometimes causing nausea. She denies red flag symptoms. Reports she did not establish with previous Gastroenterology referral. - Fatigue. States she usually goes to work then comes home to go to sleep.    Patient Active Problem List   Diagnosis Date Noted   BV (bacterial vaginosis) 10/10/2022   Vitamin D deficiency 01/12/2021     No current outpatient medications on file prior to visit.   No current facility-administered medications on file prior to visit.    No Known Allergies  Social History   Socioeconomic History   Marital status: Divorced    Spouse name: Not on file   Number of children: Not on file   Years of education: Not on file   Highest education level: Not on file  Occupational History   Not on file  Tobacco Use   Smoking status: Never   Smokeless tobacco: Never  Vaping Use   Vaping status: Never Used  Substance and Sexual Activity   Alcohol use: Not Currently   Drug use: Not Currently   Sexual activity: Not Currently    Birth control/protection: Surgical    Comment: tubal ligation  Other Topics Concern   Not on file  Social History Narrative   Not on file   Social Determinants of Health   Financial Resource Strain: Not on file  Food Insecurity: Not on file  Transportation Needs: No Transportation Needs (02/09/2021)   PRAPARE - Transportation    Lack of Transportation (Medical): No    Lack of Transportation (Non-Medical): No  Physical Activity: Not on file  Stress: Not on file   Social Connections: Not on file  Intimate Partner Violence: Not on file    Family History  Problem Relation Age of Onset   Hypertension Mother     Past Surgical History:  Procedure Laterality Date   APPENDECTOMY N/A    [facility] 10/08/2020   CHOLECYSTECTOMY N/A    [facility] 10/08/2020   LEEP  03/2020   TUBAL LIGATION N/A    [facility] 10/08/2020    ROS: Review of Systems Negative except as stated above  PHYSICAL EXAM: Pulse 64   Temp 98.1 F (36.7 C) (Oral)   Ht 4\' 11"  (1.499 m)   Wt 212 lb 3.2 oz (96.3 kg)   LMP 05/26/2023 (Approximate)   SpO2 96%   BMI 42.86 kg/m   Physical Exam HENT:     Head: Normocephalic and atraumatic.     Nose: Nose normal.     Mouth/Throat:     Mouth: Mucous membranes are moist.     Pharynx: Oropharynx is clear.  Eyes:     Extraocular Movements: Extraocular movements intact.     Conjunctiva/sclera: Conjunctivae normal.     Pupils: Pupils are equal, round, and reactive to light.  Cardiovascular:     Rate and Rhythm: Normal rate and regular rhythm.     Pulses: Normal pulses.     Heart sounds: Normal heart sounds.  Pulmonary:     Effort: Pulmonary effort is normal.     Breath sounds: Normal  breath sounds.  Abdominal:     General: Bowel sounds are normal.     Palpations: Abdomen is soft.  Musculoskeletal:        General: Normal range of motion.     Cervical back: Normal range of motion and neck supple.  Neurological:     General: No focal deficit present.     Mental Status: She is alert and oriented to person, place, and time.  Psychiatric:        Mood and Affect: Mood normal.        Behavior: Behavior normal.     ASSESSMENT AND PLAN: 1. Primary hypertension - Continue Lisinopril as prescribed.  - Counseled on blood pressure goal of less than 130/80, low-sodium, DASH diet, medication compliance, and 150 minutes of moderate intensity exercise per week as tolerated. Counseled on medication adherence and adverse effects. -  Follow-up with primary provider in 3 months or sooner if needed.  - lisinopril (ZESTRIL) 10 MG tablet; Take 1 tablet (10 mg total) by mouth daily.  Dispense: 90 tablet; Refill: 0  2. Chronic abdominal pain - Patient declined pharmacological therapy.  - Routine screening.  - Ultrasound abdomen for evaluation. - Referral to Gastroenterology for evaluation/management. During the interim follow-up with primary provider as scheduled until established with referral. - Ambulatory referral to Gastroenterology - CMP14+EGFR - CBC - Lipase - Amylase - POCT URINALYSIS DIP (CLINITEK); Future - Cervicovaginal ancillary only - POCT urine pregnancy; Future - US Abdomen Complete; Future  3. Colon cancer screening - Referral to Gastroenterology for colon cancer screening by colonoscopy. - Ambulatory referral to Gastroenterology  4. Fatigue, unspecified type - Routine screening.  - TSH - Hemoglobin A1c - Vitamin D, 25-hydroxy  5. Encounter for screening for HIV - Routine screening.  - HIV antibody (with reflex)    Patient was given the opportunity to ask questions.  Patient verbalized understanding of the plan and was able to repeat key elements of the plan. Patient was given clear instructions to go to Emergency Department or return to medical center if symptoms don't improve, worsen, or new problems develop.The patient verbalized understanding.   Orders Placed This Encounter  Procedures   US Abdomen Complete   CMP14+EGFR   CBC   Lipase   Amylase   TSH   Hemoglobin A1c   Vitamin D, 25-hydroxy   HIV antibody (with reflex)   Ambulatory referral to Gastroenterology   POCT URINALYSIS DIP (CLINITEK)   POCT urine pregnancy     Requested Prescriptions   Signed Prescriptions Disp Refills   lisinopril (ZESTRIL) 10 MG tablet 90 tablet 0    Sig: Take 1 tablet (10 mg total) by mouth daily.    Return in about 3 months (around 10/01/2023) for Follow-Up or next available chronic  conditions.  Rema Fendt, NP

## 2023-07-01 NOTE — Progress Notes (Signed)
States left side stomach pain towards to top. States this pain happens daily now.    States fatigue now.

## 2023-07-02 ENCOUNTER — Other Ambulatory Visit: Payer: Self-pay | Admitting: Family

## 2023-07-02 DIAGNOSIS — Z13228 Encounter for screening for other metabolic disorders: Secondary | ICD-10-CM

## 2023-07-02 DIAGNOSIS — E559 Vitamin D deficiency, unspecified: Secondary | ICD-10-CM

## 2023-07-02 LAB — AMYLASE: Amylase: 30 U/L — ABNORMAL LOW (ref 31–110)

## 2023-07-02 LAB — CMP14+EGFR
ALT: 56 [IU]/L — ABNORMAL HIGH (ref 0–32)
AST: 43 [IU]/L — ABNORMAL HIGH (ref 0–40)
Albumin: 4.3 g/dL (ref 3.9–4.9)
Alkaline Phosphatase: 67 [IU]/L (ref 44–121)
BUN/Creatinine Ratio: 11 (ref 9–23)
BUN: 13 mg/dL (ref 6–24)
Bilirubin Total: 0.3 mg/dL (ref 0.0–1.2)
CO2: 24 mmol/L (ref 20–29)
Calcium: 9.7 mg/dL (ref 8.7–10.2)
Chloride: 104 mmol/L (ref 96–106)
Creatinine, Ser: 1.19 mg/dL — ABNORMAL HIGH (ref 0.57–1.00)
Globulin, Total: 2.8 g/dL (ref 1.5–4.5)
Glucose: 83 mg/dL (ref 70–99)
Potassium: 4.5 mmol/L (ref 3.5–5.2)
Sodium: 142 mmol/L (ref 134–144)
Total Protein: 7.1 g/dL (ref 6.0–8.5)
eGFR: 56 mL/min/{1.73_m2} — ABNORMAL LOW (ref >59)

## 2023-07-02 LAB — CBC
Hematocrit: 38.1 % (ref 34.0–46.6)
Hemoglobin: 12.4 g/dL (ref 11.1–15.9)
MCH: 29.6 pg (ref 26.6–33.0)
MCHC: 32.5 g/dL (ref 31.5–35.7)
MCV: 91 fL (ref 79–97)
Platelets: 319 10*3/uL (ref 150–450)
RBC: 4.19 x10E6/uL (ref 3.77–5.28)
RDW: 13.3 % (ref 11.7–15.4)
WBC: 7.3 10*3/uL (ref 3.4–10.8)

## 2023-07-02 LAB — TSH: TSH: 1.91 u[IU]/mL (ref 0.450–4.500)

## 2023-07-02 LAB — VITAMIN D 25 HYDROXY (VIT D DEFICIENCY, FRACTURES): Vit D, 25-Hydroxy: 19.4 ng/mL — ABNORMAL LOW (ref 30.0–100.0)

## 2023-07-02 LAB — CERVICOVAGINAL ANCILLARY ONLY
Bacterial Vaginitis (gardnerella): NEGATIVE
Candida Glabrata: NEGATIVE
Candida Vaginitis: NEGATIVE
Chlamydia: NEGATIVE
Comment: NEGATIVE
Comment: NEGATIVE
Comment: NEGATIVE
Comment: NEGATIVE
Comment: NEGATIVE
Comment: NORMAL
Neisseria Gonorrhea: NEGATIVE
Trichomonas: NEGATIVE

## 2023-07-02 LAB — HEMOGLOBIN A1C
Est. average glucose Bld gHb Est-mCnc: 117 mg/dL
Hgb A1c MFr Bld: 5.7 % — ABNORMAL HIGH (ref 4.8–5.6)

## 2023-07-02 LAB — HIV ANTIBODY (ROUTINE TESTING W REFLEX): HIV Screen 4th Generation wRfx: NONREACTIVE

## 2023-07-02 LAB — LIPASE: Lipase: 41 U/L (ref 14–72)

## 2023-07-02 MED ORDER — VITAMIN D (ERGOCALCIFEROL) 1.25 MG (50000 UNIT) PO CAPS: 50000.0000 [IU] | ORAL_CAPSULE | ORAL | 0 refills | Status: AC

## 2023-07-03 ENCOUNTER — Other Ambulatory Visit: Payer: Managed Care, Other (non HMO)

## 2023-07-04 ENCOUNTER — Ambulatory Visit
Admission: RE | Admit: 2023-07-04 | Discharge: 2023-07-04 | Disposition: A | Discharge status: Home / Self Care | Payer: Managed Care, Other (non HMO) | Source: Ambulatory Visit | Attending: Family | Admitting: Family

## 2023-07-04 DIAGNOSIS — G8929 Other chronic pain: Secondary | ICD-10-CM

## 2023-07-12 ENCOUNTER — Encounter: Payer: Self-pay | Admitting: Gastroenterology

## 2023-07-12 ENCOUNTER — Ambulatory Visit: Payer: Managed Care, Other (non HMO) | Admitting: Gastroenterology

## 2023-07-12 ENCOUNTER — Telehealth: Payer: Self-pay | Admitting: *Deleted

## 2023-07-12 VITALS — BP 119/70 | HR 89 | Temp 98.6°F | Ht 59.0 in | Wt 206.4 lb

## 2023-07-12 DIAGNOSIS — R7989 Other specified abnormal findings of blood chemistry: Secondary | ICD-10-CM | POA: Diagnosis not present

## 2023-07-12 DIAGNOSIS — R1012 Left upper quadrant pain: Secondary | ICD-10-CM | POA: Insufficient documentation

## 2023-07-12 DIAGNOSIS — Z1211 Encounter for screening for malignant neoplasm of colon: Secondary | ICD-10-CM | POA: Insufficient documentation

## 2023-07-12 NOTE — Patient Instructions (Signed)
Complete labs and CT scan as ordered.  We will be in touch with results as available. You can complete labs at [facility], [address] in [place] Colonoscopy to be scheduled. Ultrasound recently showed findings concerning for fatty liver.  We are doing labs to rule out other possible causes.  Would recommend diet and lifestyle changes for management of fatty liver.  See handout and information provided below.  Instructions for fatty liver: Recommend 1-2# weight loss per week until ideal body weight through exercise & diet. Low fat/cholesterol diet.   Avoid sweets, sodas, fruit juices, sweetened beverages like tea, etc. Gradually increase exercise from 15 min daily up to 1 hr per day 5 days/week. Limit alcohol use.

## 2023-07-12 NOTE — Telephone Encounter (Signed)
[employer] PA for CT: pending, case # [id number], faxed clinicals to [phone]

## 2023-07-12 NOTE — Progress Notes (Signed)
GI Office Note    Referring Provider: Rema Fendt, NP Primary Care Physician:  Rema Fendt, NP  Primary Gastroenterologist: Roetta Sessions, MD   Chief Complaint   Chief Complaint  Patient presents with   Abdominal Pain    Has chronic left upper quadrant pain, states that she had an US done recently and the tech never imaged the area where she has pain.     History of Present Illness   Christine Watson is a 52 y.o. female presenting today with request of Delfin Gant, NP for abdominal pain and screening colonoscopy.  Patient reports LUQ pain going on for years but getting more frequent. Happening twice per day. Coughing/sneezing makes it trigger. Pain severe and has to breathe through and finally goes away. Not postprandially. No N/V. No bowel concerns, no constipation, diarrhea. No melena, brbpr. Thinks she has hemorrhoids. In the past she used to have a lot of reflux but this is better with weight loss and dietary changes. Uses TUMS if going to eat a trigger food. No dysphagia.   She has new elevated LFTs. Had tattoo done by a friend 30 years ago. No blood transfusions. No history of illicit drug use. No history of significant etoh use.  Abdominal ultrasound July 04, 2023: Increased hepatic parenchymal echogenicity suggestive of steatosis.  Recent new elevation of AST, ALT, increased creatinine see below.  A1c 5.7.  Vitamin D low at 19.4.   Medications   Current Outpatient Medications  Medication Sig Dispense Refill   Ascorbic Acid (VITAMIN C) 1000 MG tablet Take 1,000 mg by mouth daily.     ASHWAGANDHA GUMMIES PO Take by mouth.     lisinopril (ZESTRIL) 10 MG tablet Take 1 tablet (10 mg total) by mouth daily. 90 tablet 0   Magnesium 250 MG TABS Take 250 mg by mouth daily.     Omega 3-6-9 Fatty Acids (TRIPLE OMEGA-3-6-9 PO) Take by mouth.     Vitamin D, Ergocalciferol, (DRISDOL) 1.25 MG (50000 UNIT) CAPS capsule Take 1 capsule (50,000 Units total) by mouth  every 7 (seven) days for 12 doses. 12 capsule 0   Zinc 50 MG TABS Take 50 mg by mouth daily.     No current facility-administered medications for this visit.    Allergies   Allergies as of 07/12/2023   (No Known Allergies)    Past Medical History   Past Medical History:  Diagnosis Date   Hyperlipidemia    [facility] 10/08/2020   Hypertension    [facility] 10/08/2020    Past Surgical History   Past Surgical History:  Procedure Laterality Date   APPENDECTOMY N/A    [facility] 10/08/2020   CHOLECYSTECTOMY N/A    [facility]ia 10/08/2020   LEEP  03/2020   TUBAL LIGATION N/A    [facility]ia 10/08/2020    Past Family History   Family History  Problem Relation Age of Onset   Hypertension Mother     Past Social History   Social History   Socioeconomic History   Marital status: Divorced    Spouse name: Not on file   Number of children: Not on file   Years of education: Not on file   Highest education level: Not on file  Occupational History   Not on file  Tobacco Use   Smoking status: Never   Smokeless tobacco: Never  Vaping Use   Vaping status: Never Used  Substance and Sexual Activity   Alcohol use: Not Currently   Drug use:  Not Currently   Sexual activity: Not Currently    Birth control/protection: Surgical    Comment: tubal ligation  Other Topics Concern   Not on file  Social History Narrative   Not on file   Social Determinants of Health   Financial Resource Strain: Not on file  Food Insecurity: Not on file  Transportation Needs: No Transportation Needs (02/09/2021)   PRAPARE - Transportation    Lack of Transportation (Medical): No    Lack of Transportation (Non-Medical): No  Physical Activity: Not on file  Stress: Not on file  Social Connections: Not on file  Intimate Partner Violence: Not on file    Review of Systems   General: Negative for anorexia, unintentional weight loss, fever, chills, fatigue, weakness. Eyes: Negative for vision changes.   ENT: Negative for hoarseness, difficulty swallowing , nasal congestion. CV: Negative for chest pain, angina, palpitations, dyspnea on exertion, peripheral edema.  Respiratory: Negative for dyspnea at rest, dyspnea on exertion, cough, sputum, wheezing.  GI: See history of present illness. GU:  Negative for dysuria, hematuria, urinary incontinence, urinary frequency, nocturnal urination.  MS: Negative for joint pain, low back pain.  Derm: Negative for rash or itching.  Neuro: Negative for weakness, abnormal sensation, seizure, frequent headaches, memory loss,  confusion.  Psych: Negative for anxiety, depression, suicidal ideation, hallucinations.  Endo: Negative for unusual weight change.  Heme: Negative for bruising or bleeding. Allergy: Negative for rash or hives.  Physical Exam   BP 119/70 (BP Location: Right Arm, Patient Position: Sitting, Cuff Size: Large)   Pulse 89   Temp 98.6 F (37 C) (Oral)   Ht 4\' 11"  (1.499 m)   Wt 206 lb 6.4 oz (93.6 kg)   LMP 05/26/2023 (Approximate)   SpO2 98%   BMI 41.69 kg/m    General: Well-nourished, well-developed in no acute distress.  Head: Normocephalic, atraumatic.   Eyes: Conjunctiva pink, no icterus. Mouth: Oropharyngeal mucosa moist and pink  Neck: Supple without thyromegaly, masses, or lymphadenopathy.  Lungs: Clear to auscultation bilaterally.  Heart: Regular rate and rhythm, no murmurs rubs or gallops.  Abdomen: Bowel sounds are normal,  nondistended, no hepatosplenomegaly or masses,  no abdominal bruits or hernia, no rebound or guarding.  No tenderness on exam today. Rectal: not performed Extremities: No lower extremity edema. No clubbing or deformities.  Neuro: Alert and oriented x 4 , grossly normal neurologically.  Skin: Warm and dry, no rash or jaundice.   Psych: Alert and cooperative, normal mood and affect.  Labs   Lab Results  Component Value Date   TSH 1.910 07/01/2023   Lab Results  Component Value Date   NA  142 07/01/2023   CL 104 07/01/2023   K 4.5 07/01/2023   CO2 24 07/01/2023   BUN 13 07/01/2023   CREATININE 1.19 (H) 07/01/2023   EGFR 56 (L) 07/01/2023   CALCIUM 9.7 07/01/2023   ALBUMIN 4.3 07/01/2023   GLUCOSE 83 07/01/2023   Lab Results  Component Value Date   ALT 56 (H) 07/01/2023   AST 43 (H) 07/01/2023   ALKPHOS 67 07/01/2023   BILITOT 0.3 07/01/2023   Lab Results  Component Value Date   WBC 7.3 07/01/2023   HGB 12.4 07/01/2023   HCT 38.1 07/01/2023   MCV 91 07/01/2023   PLT 319 07/01/2023   Lab Results  Component Value Date   LIPASE 41 07/01/2023    Imaging Studies   US Abdomen Complete  Result Date: 07/04/2023 CLINICAL DATA:  Chronic abdominal  pain EXAM: ABDOMEN ULTRASOUND COMPLETE COMPARISON:  None Available. FINDINGS: Gallbladder: Surgically absent Common bile duct: Diameter: 3.4 mm Liver: Increased echogenicity. No focal lesion. Portal vein is patent on color Doppler imaging with normal direction of blood flow towards the liver. IVC: No abnormality visualized. Pancreas: Visualized portion unremarkable. Spleen: Size and appearance within normal limits. Right Kidney: Length: 10.1 cm. Echogenicity within normal limits. No mass or hydronephrosis visualized. Left Kidney: Length: 10.4 cm. Echogenicity within normal limits. No mass or hydronephrosis visualized. Abdominal aorta: No aneurysm visualized. Other findings: None. IMPRESSION: 1. Increased hepatic parenchymal echogenicity suggestive of steatosis. 2. No acute process. Electronically Signed   By: Annia Belt M.D.   On: 07/04/2023 21:35    Assessment/Plan:   LUQ pain -etiology unclear but suspicious of possible hernia, adhesions, or musculoskeletal source given worsening symptoms with sneezing/coughing/movement, lack of postprandial source -CT abdomen pelvis with contrast  Elevated LFTs -Mildly elevated transaminases -Ultrasound findings suggestive of steatosis.  At risk given obesity, history of hyperlipidemia,  prediabetes -recommend labs to rule out viral hepatitis, iron overload, autoimmune, celiac -reinforced need for low fat/low carb diet, increase exercise, weight loss towards healthy BMI -we discussed need for ongoing surveillance of fatty liver due to medication options with advanced fibrosis. Would recommend office visit yearly.  Screening colonoscopy: -Average risk -ASA 3 due to BMI >40 - I have discussed the risks, alternatives, benefits with regards to but not limited to the risk of reaction to medication, bleeding, infection, perforation and the patient is agreeable to proceed. Written consent to be obtained.       Leanna Battles. Melvyn Neth, MHS, PA-C [facility]

## 2023-07-12 NOTE — H&P (View-Only) (Signed)
 GI Office Note    Referring Provider: Rema Fendt, NP Primary Care Physician:  Rema Fendt, NP  Primary Gastroenterologist: Roetta Sessions, MD   Chief Complaint   Chief Complaint  Patient presents with   Abdominal Pain    Has chronic left upper quadrant pain, states that she had an US done recently and the tech never imaged the area where she has pain.     History of Present Illness   Christine Watson is a 52 y.o. female presenting today with request of Delfin Gant, NP for abdominal pain and screening colonoscopy.  Patient reports LUQ pain going on for years but getting more frequent. Happening twice per day. Coughing/sneezing makes it trigger. Pain severe and has to breathe through and finally goes away. Not postprandially. No N/V. No bowel concerns, no constipation, diarrhea. No melena, brbpr. Thinks she has hemorrhoids. In the past she used to have a lot of reflux but this is better with weight loss and dietary changes. Uses TUMS if going to eat a trigger food. No dysphagia.   She has new elevated LFTs. Had tattoo done by a friend 30 years ago. No blood transfusions. No history of illicit drug use. No history of significant etoh use.  Abdominal ultrasound July 04, 2023: Increased hepatic parenchymal echogenicity suggestive of steatosis.  Recent new elevation of AST, ALT, increased creatinine see below.  A1c 5.7.  Vitamin D low at 19.4.   Medications   Current Outpatient Medications  Medication Sig Dispense Refill   Ascorbic Acid (VITAMIN C) 1000 MG tablet Take 1,000 mg by mouth daily.     ASHWAGANDHA GUMMIES PO Take by mouth.     lisinopril (ZESTRIL) 10 MG tablet Take 1 tablet (10 mg total) by mouth daily. 90 tablet 0   Magnesium 250 MG TABS Take 250 mg by mouth daily.     Omega 3-6-9 Fatty Acids (TRIPLE OMEGA-3-6-9 PO) Take by mouth.     Vitamin D, Ergocalciferol, (DRISDOL) 1.25 MG (50000 UNIT) CAPS capsule Take 1 capsule (50,000 Units total) by mouth  every 7 (seven) days for 12 doses. 12 capsule 0   Zinc 50 MG TABS Take 50 mg by mouth daily.     No current facility-administered medications for this visit.    Allergies   Allergies as of 07/12/2023   (No Known Allergies)    Past Medical History   Past Medical History:  Diagnosis Date   Hyperlipidemia    [facility] 10/08/2020   Hypertension    [facility] 10/08/2020    Past Surgical History   Past Surgical History:  Procedure Laterality Date   APPENDECTOMY N/A    [facility] 10/08/2020   CHOLECYSTECTOMY N/A    [facility] 10/08/2020   LEEP  03/2020   TUBAL LIGATION N/A    [facility]ia 10/08/2020    Past Family History   Family History  Problem Relation Age of Onset   Hypertension Mother     Past Social History   Social History   Socioeconomic History   Marital status: Divorced    Spouse name: Not on file   Number of children: Not on file   Years of education: Not on file   Highest education level: Not on file  Occupational History   Not on file  Tobacco Use   Smoking status: Never   Smokeless tobacco: Never  Vaping Use   Vaping status: Never Used  Substance and Sexual Activity   Alcohol use: Not Currently   Drug use:  Not Currently   Sexual activity: Not Currently    Birth control/protection: Surgical    Comment: tubal ligation  Other Topics Concern   Not on file  Social History Narrative   Not on file   Social Determinants of Health   Financial Resource Strain: Not on file  Food Insecurity: Not on file  Transportation Needs: No Transportation Needs (02/09/2021)   PRAPARE - Transportation    Lack of Transportation (Medical): No    Lack of Transportation (Non-Medical): No  Physical Activity: Not on file  Stress: Not on file  Social Connections: Not on file  Intimate Partner Violence: Not on file    Review of Systems   General: Negative for anorexia, unintentional weight loss, fever, chills, fatigue, weakness. Eyes: Negative for vision changes.   ENT: Negative for hoarseness, difficulty swallowing , nasal congestion. CV: Negative for chest pain, angina, palpitations, dyspnea on exertion, peripheral edema.  Respiratory: Negative for dyspnea at rest, dyspnea on exertion, cough, sputum, wheezing.  GI: See history of present illness. GU:  Negative for dysuria, hematuria, urinary incontinence, urinary frequency, nocturnal urination.  MS: Negative for joint pain, low back pain.  Derm: Negative for rash or itching.  Neuro: Negative for weakness, abnormal sensation, seizure, frequent headaches, memory loss,  confusion.  Psych: Negative for anxiety, depression, suicidal ideation, hallucinations.  Endo: Negative for unusual weight change.  Heme: Negative for bruising or bleeding. Allergy: Negative for rash or hives.  Physical Exam   BP 119/70 (BP Location: Right Arm, Patient Position: Sitting, Cuff Size: Large)   Pulse 89   Temp 98.6 F (37 C) (Oral)   Ht 4\' 11"  (1.499 m)   Wt 206 lb 6.4 oz (93.6 kg)   LMP 05/26/2023 (Approximate)   SpO2 98%   BMI 41.69 kg/m    General: Well-nourished, well-developed in no acute distress.  Head: Normocephalic, atraumatic.   Eyes: Conjunctiva pink, no icterus. Mouth: Oropharyngeal mucosa moist and pink  Neck: Supple without thyromegaly, masses, or lymphadenopathy.  Lungs: Clear to auscultation bilaterally.  Heart: Regular rate and rhythm, no murmurs rubs or gallops.  Abdomen: Bowel sounds are normal,  nondistended, no hepatosplenomegaly or masses,  no abdominal bruits or hernia, no rebound or guarding.  No tenderness on exam today. Rectal: not performed Extremities: No lower extremity edema. No clubbing or deformities.  Neuro: Alert and oriented x 4 , grossly normal neurologically.  Skin: Warm and dry, no rash or jaundice.   Psych: Alert and cooperative, normal mood and affect.  Labs   Lab Results  Component Value Date   TSH 1.910 07/01/2023   Lab Results  Component Value Date   NA  142 07/01/2023   CL 104 07/01/2023   K 4.5 07/01/2023   CO2 24 07/01/2023   BUN 13 07/01/2023   CREATININE 1.19 (H) 07/01/2023   EGFR 56 (L) 07/01/2023   CALCIUM 9.7 07/01/2023   ALBUMIN 4.3 07/01/2023   GLUCOSE 83 07/01/2023   Lab Results  Component Value Date   ALT 56 (H) 07/01/2023   AST 43 (H) 07/01/2023   ALKPHOS 67 07/01/2023   BILITOT 0.3 07/01/2023   Lab Results  Component Value Date   WBC 7.3 07/01/2023   HGB 12.4 07/01/2023   HCT 38.1 07/01/2023   MCV 91 07/01/2023   PLT 319 07/01/2023   Lab Results  Component Value Date   LIPASE 41 07/01/2023    Imaging Studies   US Abdomen Complete  Result Date: 07/04/2023 CLINICAL DATA:  Chronic abdominal  pain EXAM: ABDOMEN ULTRASOUND COMPLETE COMPARISON:  None Available. FINDINGS: Gallbladder: Surgically absent Common bile duct: Diameter: 3.4 mm Liver: Increased echogenicity. No focal lesion. Portal vein is patent on color Doppler imaging with normal direction of blood flow towards the liver. IVC: No abnormality visualized. Pancreas: Visualized portion unremarkable. Spleen: Size and appearance within normal limits. Right Kidney: Length: 10.1 cm. Echogenicity within normal limits. No mass or hydronephrosis visualized. Left Kidney: Length: 10.4 cm. Echogenicity within normal limits. No mass or hydronephrosis visualized. Abdominal aorta: No aneurysm visualized. Other findings: None. IMPRESSION: 1. Increased hepatic parenchymal echogenicity suggestive of steatosis. 2. No acute process. Electronically Signed   By: Annia Belt M.D.   On: 07/04/2023 21:35    Assessment/Plan:   LUQ pain -etiology unclear but suspicious of possible hernia, adhesions, or musculoskeletal source given worsening symptoms with sneezing/coughing/movement, lack of postprandial source -CT abdomen pelvis with contrast  Elevated LFTs -Mildly elevated transaminases -Ultrasound findings suggestive of steatosis.  At risk given obesity, history of hyperlipidemia,  prediabetes -recommend labs to rule out viral hepatitis, iron overload, autoimmune, celiac -reinforced need for low fat/low carb diet, increase exercise, weight loss towards healthy BMI -we discussed need for ongoing surveillance of fatty liver due to medication options with advanced fibrosis. Would recommend office visit yearly.  Screening colonoscopy: -Average risk -ASA 3 due to BMI >40 - I have discussed the risks, alternatives, benefits with regards to but not limited to the risk of reaction to medication, bleeding, infection, perforation and the patient is agreeable to proceed. Written consent to be obtained.       Leanna Battles. Melvyn Neth, MHS, PA-C [facility]

## 2023-07-15 LAB — IRON,TIBC AND FERRITIN PANEL
Ferritin: 106 ng/mL (ref 15–150)
Iron Saturation: 14 % — ABNORMAL LOW (ref 15–55)
Iron: 49 ug/dL (ref 27–159)
Total Iron Binding Capacity: 361 ug/dL (ref 250–450)
UIBC: 312 ug/dL (ref 131–425)

## 2023-07-15 LAB — HCG, SERUM, QUALITATIVE: hCG,Beta Subunit,Qual,Serum: NEGATIVE m[IU]/mL (ref <6)

## 2023-07-15 LAB — HEPATIC FUNCTION PANEL
ALT: 55 IU/L — ABNORMAL HIGH (ref 0–32)
AST: 39 IU/L (ref 0–40)
Albumin: 4.6 g/dL (ref 3.9–4.9)
Alkaline Phosphatase: 95 IU/L (ref 44–121)
Bilirubin Total: 0.4 mg/dL (ref 0.0–1.2)
Bilirubin, Direct: 0.12 mg/dL (ref 0.00–0.40)
Total Protein: 8 g/dL (ref 6.0–8.5)

## 2023-07-15 LAB — IGG, IGA, IGM
IgA/Immunoglobulin A, Serum: 525 mg/dL — ABNORMAL HIGH (ref 87–352)
IgG (Immunoglobin G), Serum: 1599 mg/dL (ref 586–1602)
IgM (Immunoglobulin M), Srm: 99 mg/dL (ref 26–217)

## 2023-07-15 LAB — HEPATITIS B CORE ANTIBODY, TOTAL: Hep B Core Total Ab: NEGATIVE

## 2023-07-15 LAB — HEPATITIS C ANTIBODY: Hep C Virus Ab: NONREACTIVE

## 2023-07-15 LAB — HEPATITIS A ANTIBODY, TOTAL: hep A Total Ab: POSITIVE — AB

## 2023-07-15 LAB — HEPATITIS B SURFACE ANTIGEN: Hepatitis B Surface Ag: NEGATIVE

## 2023-07-15 LAB — MITOCHONDRIAL/SMOOTH MUSCLE AB PNL
Mitochondrial Ab: <20 U (ref 0.0–20.0)
Smooth Muscle Ab: 10 U (ref 0–19)

## 2023-07-15 LAB — TISSUE TRANSGLUTAMINASE, IGA: Transglutaminase IgA: <2 U/mL (ref 0–3)

## 2023-07-15 LAB — ANA: Anti Nuclear Antibody (ANA): NEGATIVE

## 2023-07-15 LAB — HEPATITIS B SURFACE ANTIBODY,QUALITATIVE: Hep B Surface Ab, Qual: REACTIVE

## 2023-07-16 NOTE — Telephone Encounter (Signed)
PA still pending.  

## 2023-07-17 ENCOUNTER — Other Ambulatory Visit: Payer: Self-pay | Admitting: *Deleted

## 2023-07-17 ENCOUNTER — Encounter: Payer: Self-pay | Admitting: *Deleted

## 2023-07-17 MED ORDER — PEG 3350-KCL-NA BICARB-NACL 420 G PO SOLR: 4000.0000 mL | Freq: Once | ORAL | 0 refills | Status: AC

## 2023-07-17 NOTE — Telephone Encounter (Signed)
She will be getting a call from Tammy C regarding blood work. See result note.  Ok to wait on CT and move forward with screening colonoscopy.

## 2023-07-17 NOTE — Telephone Encounter (Signed)
Called pt to inform her that PA for CT was approved and the facility that it had to be done at was [facility] in [place]. She stated she received a call yesterday from [employer] letting her know. She says she would rather do the colonoscopy first to see anything is found before she does the CT.  She said the amount she would have to pay was $700, so she would rather do the colonoscopy first.  Also, she would like to go over her lab work and wanted to know if she needs to come in or can it be done via video visit.

## 2023-07-17 NOTE — Addendum Note (Signed)
Addended by: Elinor Dodge on: 07/17/2023 09:58 AM   Modules accepted: Orders

## 2023-07-17 NOTE — Telephone Encounter (Signed)
Noted. Pt has been scheduled for 07/31/23. Instructions sent via mychart and prep sent to the pharmacy.

## 2023-07-18 NOTE — Telephone Encounter (Signed)
Pt has been scheduled for 07/26/23, arrive at 1:15 pm, take id and insurance card to [facility].    [employer] PA for CT: Authorization # [id number] DOS: 07/12/23-01/08/24

## 2023-07-26 NOTE — Patient Instructions (Addendum)
Christine Watson  07/26/2023     @PREFPERIOPPHARMACY @   Your procedure is scheduled on 07/31/2023.  Report to [facility] at 10:30 A.M.  Call this number if you have problems the morning of surgery:  [phone]  If you experience any cold or flu symptoms such as cough, fever, chills, shortness of breath, etc. between now and your scheduled surgery, please notify us at the above number.   Remember:  Please Follow the diet and prep instructions given to you by Dr Luvenia Starch office.     You may drink clear liquids until 8:30am .  Clear liquids allowed are:                    Water, Juice (No red color; non-citric and without pulp; diabetics please choose diet or no sugar options), Carbonated beverages (diabetics please choose diet or no sugar options), Clear Tea (No creamer, milk, or cream, including half & half and powdered creamer), and Clear Sports drink (No red color; diabetics please choose diet or no sugar options)    Take these medicines the morning of surgery with A SIP OF WATER : none    Do not wear jewelry, make-up or nail polish, including gel polish,  artificial nails, or any other type of covering on natural nails (fingers and  toes).  Do not wear lotions, powders, or perfumes, or deodorant.  Do not shave 48 hours prior to surgery.  Men may shave face and neck.  Do not bring valuables to the hospital.  [facility] is not responsible for any belongings or valuables.  Contacts, dentures or bridgework may not be worn into surgery.  Leave your suitcase in the car.  After surgery it may be brought to your room.  For patients admitted to the hospital, discharge time will be determined by your treatment team.  Patients discharged the day of surgery will not be allowed to drive home.   Name and phone number of your driver:   Family Special instructions:  N/A  Please read over the following fact sheets that you were given. Care and Recovery After Surgery  Colonoscopy,  Adult A colonoscopy is a procedure to look at the entire large intestine. This procedure is done using a long, thin, flexible tube that has a camera on the end. You may have a colonoscopy: As a part of normal colorectal screening. If you have certain symptoms, such as: A low number of red blood cells in your blood (anemia). Diarrhea that does not go away. Pain in your abdomen. Blood in your stool. A colonoscopy can help screen for and diagnose medical problems, including: An abnormal growth of cells or tissue (tumor). Abnormal growths within the lining of your intestine (polyps). Inflammation. Areas of bleeding. Tell your health care provider about: Any allergies you have. All medicines you are taking, including vitamins, herbs, eye drops, creams, and over-the-counter medicines. Any problems you or family members have had with anesthetic medicines. Any bleeding problems you have. Any surgeries you have had. Any medical conditions you have. Any problems you have had with having bowel movements. Whether you are pregnant or may be pregnant. What are the risks? Generally, this is a safe procedure. However, problems may occur, including: Bleeding. Damage to your intestine. Allergic reactions to medicines given during the procedure. Infection. This is rare. What happens before the procedure? Eating and drinking restrictions Follow instructions from your health care provider about eating or drinking restrictions, which may include: A few days  before the procedure: Follow a low-fiber diet. Avoid nuts, seeds, dried fruit, raw fruits, and vegetables. 1-3 days before the procedure: Eat only gelatin dessert or ice pops. Drink only clear liquids, such as water, clear juice, clear broth or bouillon, black coffee or tea, or clear soft drinks or sports drinks. Avoid liquids that contain red or purple dye. The day of the procedure: Do not eat solid foods. You may continue to drink clear  liquids until up to 2 hours before the procedure. Do not eat or drink anything starting 2 hours before the procedure, or within the time period that your health care provider recommends. Bowel prep If you were prescribed a bowel prep to take by mouth (orally) to clean out your colon: Take it as told by your health care provider. Starting the day before your procedure, you will need to drink a large amount of liquid medicine. The liquid will cause you to have many bowel movements of loose stool until your stool becomes almost clear or light green. If your skin or the opening between the buttocks (anus) gets irritated from diarrhea, you may relieve the irritation using: Wipes with medicine in them, such as adult wet wipes with aloe and vitamin E. A product to soothe skin, such as petroleum jelly. If you vomit while drinking the bowel prep: Take a break for up to 60 minutes. Begin the bowel prep again. Call your health care provider if you keep vomiting or you cannot take the bowel prep without vomiting. To clean out your colon, you may also be given: Laxative medicines. These help you have a bowel movement. Instructions for enema use. An enema is liquid medicine injected into your rectum. Medicines Ask your health care provider about: Changing or stopping your regular medicines or supplements. This is especially important if you are taking iron supplements, diabetes medicines, or blood thinners. Taking medicines such as aspirin and ibuprofen. These medicines can thin your blood. Do not take these medicines unless your health care provider tells you to take them. Taking over-the-counter medicines, vitamins, herbs, and supplements. General instructions Ask your health care provider what steps will be taken to help prevent infection. These may include washing skin with a germ-killing soap. If you will be going home right after the procedure, plan to have a responsible adult: Take you home from the  hospital or clinic. You will not be allowed to drive. Care for you for the time you are told. What happens during the procedure?  An IV will be inserted into one of your veins. You will be given a medicine to make you fall asleep (general anesthetic). You will lie on your side with your knees bent. A lubricant will be put on the tube. Then the tube will be: Inserted into your anus. Gently eased through all parts of your large intestine. Air will be sent into your colon to keep it open. This may cause some pressure or cramping. Images will be taken with the camera and will appear on a screen. A small tissue sample may be removed to be looked at under a microscope (biopsy). The tissue may be sent to a lab for testing if any signs of problems are found. If small polyps are found, they may be removed and checked for cancer cells. When the procedure is finished, the tube will be removed. The procedure may vary among health care providers and hospitals. What happens after the procedure? Your blood pressure, heart rate, breathing rate, and blood oxygen level  will be monitored until you leave the hospital or clinic. You may have a small amount of blood in your stool. You may pass gas and have mild cramping or bloating in your abdomen. This is caused by the air that was used to open your colon during the exam. If you were given a sedative during the procedure, it can affect you for several hours. Do not drive or operate machinery until your health care provider says that it is safe. It is up to you to get the results of your procedure. Ask your health care provider, or the department that is doing the procedure, when your results will be ready. Summary A colonoscopy is a procedure to look at the entire large intestine. Follow instructions from your health care provider about eating and drinking before the procedure. If you were prescribed an oral bowel prep to clean out your colon, take it as told by  your health care provider. During the colonoscopy, a flexible tube with a camera on its end is inserted into the anus and then passed into all parts of the large intestine. This information is not intended to replace advice given to you by your health care provider. Make sure you discuss any questions you have with your health care provider. Document Revised: 10/09/2022 Document Reviewed: 04/19/2021 Elsevier Patient Education  2024 Else[employer]  Monitored Anesthesia Care Anesthesia refers to the techniques, procedures, and medicines that help a person stay safe and comfortable during surgery. Monitored anesthesia care, or sedation, is one type of anesthesia. You may have sedation if you do not need to be asleep for your procedure. Procedures that use sedation may include: Surgery to remove cataracts from your eyes. A dental procedure. A biopsy. This is when a tissue sample is removed and looked at under a microscope. You will be watched closely during your procedure. Your level of sedation or type of anesthesia may be changed to fit your needs. Tell a health care provider about: Any allergies you have. All medicines you are taking, including vitamins, herbs, eye drops, creams, and over-the-counter medicines. Any problems you or family members have had with anesthesia. Any bleeding problems you have. Any surgeries you have had. Any medical conditions or illnesses you have. This includes sleep apnea, cough, fever, or the flu. Whether you are pregnant or may be pregnant. Whether you use cigarettes, alcohol, or drugs. Any use of steroids, whether by mouth or as a cream. What are the risks? Your health care provider will talk with you about risks. These may include: Getting too much medicine (oversedation). Nausea. Allergic reactions to medicines. Trouble breathing. If this happens, a breathing tube may be used to help you breathe. It will be removed when you are awake and breathing on your  own. Heart trouble. Lung trouble. Confusion that gets better with time (emergence delirium). What happens before the procedure? When to stop eating and drinking Follow instructions from your health care provider about what you may eat and drink. These may include: 8 hours before your procedure Stop eating most foods. Do not eat meat, fried foods, or fatty foods. Eat only light foods, such as toast or crackers. All liquids are okay except energy drinks and alcohol. 6 hours before your procedure Stop eating. Drink only clear liquids, such as water, clear fruit juice, black coffee, plain tea, and sports drinks. Do not drink energy drinks or alcohol. 2 hours before your procedure Stop drinking all liquids. You may be allowed to take medicines with  small sips of water. If you do not follow your health care provider's instructions, your procedure may be delayed or canceled. Medicines Ask your health care provider about: Changing or stopping your regular medicines. These include any diabetes medicines or blood thinners you take. Taking medicines such as aspirin and ibuprofen. These medicines can thin your blood. Do not take them unless your health care provider tells you to. Taking over-the-counter medicines, vitamins, herbs, and supplements. Testing You may have an exam or testing. You may have a blood or urine sample taken. General instructions Do not use any products that contain nicotine or tobacco for at least 4 weeks before the procedure. These products include cigarettes, chewing tobacco, and vaping devices, such as e-cigarettes. If you need help quitting, ask your health care provider. If you will be going home right after the procedure, plan to have a responsible adult: Take you home from the hospital or clinic. You will not be allowed to drive. Care for you for the time you are told. What happens during the procedure?  Your blood pressure, heart rate, breathing, level of pain,  and blood oxygen level will be monitored. An IV will be inserted into one of your veins. You may be given: A sedative. This helps you relax. Anesthesia. This will: Numb certain areas of your body. Make you fall asleep for surgery. You will be given medicines as needed to keep you comfortable. The more medicine you are given, the deeper your level of sedation will be. Your level of sedation may be changed to fit your needs. There are three levels of sedation: Mild sedation. At this level, you may feel awake and relaxed. You will be able to follow directions. Moderate sedation. At this level, you will be sleepy. You may not remember the procedure. Deep sedation. At this level, you will be asleep. You will not remember the procedure. How you get the medicines will depend on your age and the procedure. They may be given as: A pill. This may be taken by mouth (orally) or inserted into the rectum. An injection. This may be into a vein or muscle. A spray through the nose. After your procedure is over, the medicine will be stopped. The procedure may vary among health care providers and hospitals. What happens after the procedure? Your blood pressure, heart rate, breathing rate, and blood oxygen level will be monitored until you leave the hospital or clinic. You may feel sleepy, clumsy, or nauseous. You may not remember what happened during or after the procedure. Sedation can affect you for several hours. Do not drive or use machinery until your health care provider says that it is safe. This information is not intended to replace advice given to you by your health care provider. Make sure you discuss any questions you have with your health care provider. Document Revised: 01/22/2022 Document Reviewed: 01/22/2022 Elsevier Patient Education  2024 [employer].

## 2023-07-29 ENCOUNTER — Encounter (HOSPITAL_COMMUNITY): Payer: Self-pay

## 2023-07-29 ENCOUNTER — Encounter (HOSPITAL_COMMUNITY)
Admission: RE | Admit: 2023-07-29 | Discharge: 2023-07-29 | Disposition: A | Discharge status: Home / Self Care | Payer: Managed Care, Other (non HMO) | Source: Ambulatory Visit | Attending: Internal Medicine | Admitting: Internal Medicine

## 2023-07-29 VITALS — BP 119/70 | HR 89 | Temp 97.8°F | Resp 18 | Ht 59.0 in | Wt 206.4 lb

## 2023-07-29 DIAGNOSIS — R001 Bradycardia, unspecified: Secondary | ICD-10-CM | POA: Diagnosis not present

## 2023-07-29 DIAGNOSIS — Z01818 Encounter for other preprocedural examination: Secondary | ICD-10-CM | POA: Insufficient documentation

## 2023-07-29 DIAGNOSIS — I10 Essential (primary) hypertension: Secondary | ICD-10-CM

## 2023-07-29 DIAGNOSIS — Z01810 Encounter for preprocedural cardiovascular examination: Secondary | ICD-10-CM | POA: Diagnosis present

## 2023-07-29 DIAGNOSIS — Z01812 Encounter for preprocedural laboratory examination: Secondary | ICD-10-CM | POA: Diagnosis present

## 2023-07-29 LAB — POCT PREGNANCY, URINE: Preg Test, Ur: NEGATIVE

## 2023-07-31 ENCOUNTER — Ambulatory Visit (HOSPITAL_COMMUNITY): Payer: Managed Care, Other (non HMO) | Admitting: Anesthesiology

## 2023-07-31 ENCOUNTER — Encounter (HOSPITAL_COMMUNITY): Payer: Self-pay | Admitting: Internal Medicine

## 2023-07-31 ENCOUNTER — Ambulatory Visit (HOSPITAL_COMMUNITY)
Admission: RE | Admit: 2023-07-31 | Discharge: 2023-07-31 | Disposition: A | Discharge status: Home / Self Care | Payer: Managed Care, Other (non HMO) | Attending: Internal Medicine | Admitting: Internal Medicine

## 2023-07-31 ENCOUNTER — Encounter (HOSPITAL_COMMUNITY)
Admission: RE | Disposition: A | Discharge status: Home / Self Care | Payer: Self-pay | Source: Home / Self Care | Attending: Internal Medicine

## 2023-07-31 ENCOUNTER — Ambulatory Visit (HOSPITAL_BASED_OUTPATIENT_CLINIC_OR_DEPARTMENT_OTHER): Payer: Managed Care, Other (non HMO) | Admitting: Anesthesiology

## 2023-07-31 DIAGNOSIS — D123 Benign neoplasm of transverse colon: Secondary | ICD-10-CM | POA: Insufficient documentation

## 2023-07-31 DIAGNOSIS — Z6841 Body Mass Index (BMI) 40.0 and over, adult: Secondary | ICD-10-CM | POA: Diagnosis not present

## 2023-07-31 DIAGNOSIS — D122 Benign neoplasm of ascending colon: Secondary | ICD-10-CM

## 2023-07-31 DIAGNOSIS — K219 Gastro-esophageal reflux disease without esophagitis: Secondary | ICD-10-CM | POA: Diagnosis not present

## 2023-07-31 DIAGNOSIS — Z1211 Encounter for screening for malignant neoplasm of colon: Secondary | ICD-10-CM | POA: Diagnosis present

## 2023-07-31 DIAGNOSIS — E6689 Other obesity not elsewhere classified: Secondary | ICD-10-CM | POA: Diagnosis not present

## 2023-07-31 HISTORY — PX: COLONOSCOPY WITH PROPOFOL: SHX5780

## 2023-07-31 HISTORY — PX: POLYPECTOMY: SHX5525

## 2023-07-31 SURGERY — COLONOSCOPY WITH PROPOFOL
Anesthesia: General

## 2023-07-31 MED ORDER — PROPOFOL 500 MG/50ML IV EMUL
INTRAVENOUS | Status: DC | PRN
  Administered 2023-07-31: 150 ug/kg/min via INTRAVENOUS

## 2023-07-31 MED ORDER — PROPOFOL 10 MG/ML IV BOLUS
INTRAVENOUS | Status: DC | PRN
  Administered 2023-07-31: 50 mg via INTRAVENOUS
  Administered 2023-07-31: 100 mg via INTRAVENOUS

## 2023-07-31 MED ORDER — LACTATED RINGERS IV SOLN: INTRAVENOUS | Status: DC

## 2023-07-31 MED ORDER — LIDOCAINE HCL (PF) 2 % IJ SOLN
INTRAMUSCULAR | Status: DC | PRN
  Administered 2023-07-31: 50 mg via INTRADERMAL

## 2023-07-31 NOTE — Anesthesia Preprocedure Evaluation (Signed)
Anesthesia Evaluation  Patient identified by MRN, date of birth, ID band Patient awake    Reviewed: Allergy & Precautions, H&P , NPO status , Patient's Chart, lab work & pertinent test results, reviewed documented beta blocker date and time   Airway Mallampati: II  TM Distance: >3 FB Neck ROM: full    Dental no notable dental hx.    Pulmonary neg pulmonary ROS   Pulmonary exam normal breath sounds clear to auscultation       Cardiovascular Exercise Tolerance: Good hypertension, negative cardio ROS  Rhythm:regular Rate:Normal     Neuro/Psych negative neurological ROS  negative psych ROS   GI/Hepatic negative GI ROS, Neg liver ROS,,,  Endo/Other    Class 4 obesity  Renal/GU negative Renal ROS  negative genitourinary   Musculoskeletal   Abdominal   Peds  Hematology negative hematology ROS (+)   Anesthesia Other Findings   Reproductive/Obstetrics negative OB ROS                             Anesthesia Physical Anesthesia Plan  ASA: 3  Anesthesia Plan: General   Post-op Pain Management:    Induction:   PONV Risk Score and Plan: Propofol infusion  Airway Management Planned:   Additional Equipment:   Intra-op Plan:   Post-operative Plan:   Informed Consent: I have reviewed the patients History and Physical, chart, labs and discussed the procedure including the risks, benefits and alternatives for the proposed anesthesia with the patient or authorized representative who has indicated his/her understanding and acceptance.     Dental Advisory Given  Plan Discussed with: CRNA  Anesthesia Plan Comments:        Anesthesia Quick Evaluation

## 2023-07-31 NOTE — Anesthesia Procedure Notes (Signed)
Date/Time: 07/31/2023 1:16 PM  Performed by: Julian Reil, CRNAPre-anesthesia Checklist: Patient identified, Emergency Drugs available, Suction available and Patient being monitored Patient Re-evaluated:Patient Re-evaluated prior to induction Oxygen Delivery Method: Nasal cannula Induction Type: IV induction Placement Confirmation: positive ETCO2

## 2023-07-31 NOTE — Transfer of Care (Addendum)
Immediate Anesthesia Transfer of Care Note  Patient: Christine Watson  Procedure(s) Performed: COLONOSCOPY WITH PROPOFOL POLYPECTOMY  Patient Location: Short Stay  Anesthesia Type:General  Level of Consciousness: awake, alert , and oriented  Airway & Oxygen Therapy: Patient Spontanous Breathing  Post-op Assessment: Report given to RN and Post -op Vital signs reviewed and stable  Post vital signs: Reviewed and stable  Last Vitals:  Vitals Value Taken Time  BP 84/47   Temp 97.9   Pulse 84   Resp 22   SpO2 98%     Last Pain:  Vitals:   07/31/23 1045  PainSc: 0-No pain         Complications: No notable events documented.

## 2023-07-31 NOTE — Interval H&P Note (Signed)
History and Physical Interval Note:  07/31/2023 1:08 PM  Christine Watson  has presented today for surgery, with the diagnosis of screening.  The various methods of treatment have been discussed with the patient and family. After consideration of risks, benefits and other options for treatment, the patient has consented to  Procedure(s) with comments: COLONOSCOPY WITH PROPOFOL (N/A) - 12:45 pm, asa 3 as a surgical intervention.  The patient's history has been reviewed, patient examined, no change in status, stable for surgery.  I have reviewed the patient's chart and labs.  Questions were answered to the patient's satisfaction.      no change.  Persisting left upper quadrant pain after colonic purge.  Here for screening colonoscopy.The risks, benefits, limitations, alternatives and imponderables have been reviewed with the patient. Questions have been answered. All parties are agreeable.     Eula Listen

## 2023-07-31 NOTE — Op Note (Signed)
[facility] Patient Name: Christine Watson Procedure Date: 07/31/2023 12:18 PM MRN: [id number] Date of Birth: May 04, 1971 Attending MD: Gennette Pac , MD, [id number] CSN: [id number] Age: 52 Admit Type: Outpatient Procedure:                Colonoscopy Indications:              Screening for colorectal malignant neoplasm Providers:                Gennette Pac, MD, Nena Polio, RN, Francoise Ceo RN, RN, Elinor Parkinson Referring MD:              Medicines:                Propofol per Anesthesia Complications:            No immediate complications. Estimated Blood Loss:     Estimated blood loss was minimal. Estimated blood                            loss was minimal. Procedure:                Pre-Anesthesia Assessment:                           - Prior to the procedure, a History and Physical                            was performed, and patient medications and                            allergies were reviewed. The patient's tolerance of                            previous anesthesia was also reviewed. The risks                            and benefits of the procedure and the sedation                            options and risks were discussed with the patient.                            All questions were answered, and informed consent                            was obtained. Prior Anticoagulants: The patient has                            taken no anticoagulant or antiplatelet agents. ASA                            Grade Assessment: III - A patient with severe  systemic disease. After reviewing the risks and                            benefits, the patient was deemed in satisfactory                            condition to undergo the procedure.                           After obtaining informed consent, the colonoscope                            was passed under direct vision. Throughout the                             procedure, the patient's blood pressure, pulse, and                            oxygen saturations were monitored continuously. The                            [id number]) scope was introduced through the                            anus and advanced to the the cecum, identified by                            appendiceal orifice and ileocecal valve. The                            colonoscopy was performed without difficulty. The                            patient tolerated the procedure well. The quality                            of the bowel preparation was adequate. The                            ileocecal valve, appendiceal orifice, and rectum                            were photographed. Scope In: 1:20:56 PM Scope Out: 1:32:16 PM Scope Withdrawal Time: 0 hours 6 minutes 51 seconds  Total Procedure Duration: 0 hours 11 minutes 20 seconds  Findings:      The perianal and digital rectal examinations were normal.      A 4 mm polyp was found in the hepatic flexure. The polyp was sessile.       The polyp was removed with a cold snare. Resection and retrieval were       complete. Estimated blood loss was minimal.      The exam was otherwise without abnormality on direct and retroflexion       views. Impression:               -  One 4 mm polyp at the hepatic flexure, removed                            with a cold snare. Resected and retrieved.                           - The examination was otherwise normal on direct                            and retroflexion views. Moderate Sedation:      Moderate (conscious) sedation was personally administered by an       anesthesia professional. The following parameters were monitored: oxygen       saturation, heart rate, blood pressure, respiratory rate, EKG, adequacy       of pulmonary ventilation, and response to care. Recommendation:           - Patient has a contact number available for                            emergencies. The signs  and symptoms of potential                            delayed complications were discussed with the                            patient. Return to normal activities tomorrow.                            Written discharge instructions were provided to the                            patient.                           - Advance diet as tolerated.                           - Continue present medications.                           - Repeat colonoscopy date to be determined after                            pending pathology results are reviewed for                            surveillance.                           - Return to GI office in 2 months. Procedure Code(s):        --- Professional ---                           [phone], Colonoscopy, flexible; with removal of  tumor(s), polyp(s), or other lesion(s) by snare                            technique Diagnosis Code(s):        --- Professional ---                           Z12.11, Encounter for screening for malignant                            neoplasm of colon                           D12.3, Benign neoplasm of transverse colon (hepatic                            flexure or splenic flexure) CPT copyright 2022 American Medical Association. All rights reserved. The codes documented in this report are preliminary and upon coder review may  be revised to meet current compliance requirements. Gerrit Friends. Wardell Pokorski, MD Gennette Pac, MD 07/31/2023 1:42:43 PM This report has been signed electronically. Number of Addenda: 0

## 2023-07-31 NOTE — Discharge Instructions (Addendum)
  Colonoscopy Discharge Instructions  Read the instructions outlined below and refer to this sheet in the next few weeks. These discharge instructions provide you with general information on caring for yourself after you leave the hospital. Your doctor may also give you specific instructions. While your treatment has been planned according to the most current medical practices available, unavoidable complications occasionally occur. If you have any problems or questions after discharge, call Dr. Jena Gauss at [phone]. ACTIVITY You may resume your regular activity, but move at a slower pace for the next 24 hours.  Take frequent rest periods for the next 24 hours.  Walking will help get rid of the air and reduce the bloated feeling in your belly (abdomen).  No driving for 24 hours (because of the medicine (anesthesia) used during the test).   Do not sign any important legal documents or operate any machinery for 24 hours (because of the anesthesia used during the test).  NUTRITION Drink plenty of fluids.  You may resume your normal diet as instructed by your doctor.  Begin with a light meal and progress to your normal diet. Heavy or fried foods are harder to digest and may make you feel sick to your stomach (nauseated).  Avoid alcoholic beverages for 24 hours or as instructed.  MEDICATIONS You may resume your normal medications unless your doctor tells you otherwise.  WHAT YOU CAN EXPECT TODAY Some feelings of bloating in the abdomen.  Passage of more gas than usual.  Spotting of blood in your stool or on the toilet paper.  IF YOU HAD POLYPS REMOVED DURING THE COLONOSCOPY: No aspirin products for 7 days or as instructed.  No alcohol for 7 days or as instructed.  Eat a soft diet for the next 24 hours.  FINDING OUT THE RESULTS OF YOUR TEST Not all test results are available during your visit. If your test results are not back during the visit, make an appointment with your caregiver to find out the  results. Do not assume everything is normal if you have not heard from your caregiver or the medical facility. It is important for you to follow up on all of your test results.  SEEK IMMEDIATE MEDICAL ATTENTION IF: You have more than a spotting of blood in your stool.  Your belly is swollen (abdominal distention).  You are nauseated or vomiting.  You have a temperature over 101.  You have abdominal pain or discomfort that is severe or gets worse throughout the day.     1 polyp found and removed from your colon today  Further recommendations to follow pending review of pathology report  Follow-up appointment with Tana Coast in 2 months from now  At patient request, I called Ms.  Marcene Corning [phone] -reviewed findings.

## 2023-08-01 LAB — SURGICAL PATHOLOGY

## 2023-08-02 NOTE — Anesthesia Postprocedure Evaluation (Signed)
Anesthesia Post Note  Patient: Christine Watson  Procedure(s) Performed: COLONOSCOPY WITH PROPOFOL POLYPECTOMY  Patient location during evaluation: Phase II Anesthesia Type: General Level of consciousness: awake Pain management: pain level controlled Vital Signs Assessment: post-procedure vital signs reviewed and stable Respiratory status: spontaneous breathing and respiratory function stable Cardiovascular status: blood pressure returned to baseline and stable Postop Assessment: no headache and no apparent nausea or vomiting Anesthetic complications: no Comments: Late entry   No notable events documented.   Last Vitals:  Vitals:   07/31/23 1045 07/31/23 1339  BP: 111/68 (!) 91/58  Pulse: 69   Resp: (!) 22 (!) 21  Temp: 36.8 C 36.6 C  SpO2: 99% 99%    Last Pain:  Vitals:   08/01/23 1344  TempSrc:   PainSc: 0-No pain                 Windell Norfolk

## 2023-08-06 ENCOUNTER — Encounter (HOSPITAL_COMMUNITY): Payer: Self-pay | Admitting: Internal Medicine

## 2023-08-10 ENCOUNTER — Encounter: Payer: Self-pay | Admitting: Internal Medicine

## 2023-09-15 ENCOUNTER — Telehealth: Payer: Self-pay | Admitting: Gastroenterology

## 2023-09-15 NOTE — Telephone Encounter (Signed)
 Reviewed CT Abd/pelvis, done at [facility].  Liver ok. Nothing to explain LUQ pain.  Small simple appearing left adnexal cyst.  Recommend she follow with gyn for cyst, likely benign. Recommend ov here as planned in jan or feb, make ov if not done. Ov for fatty liver/elevated lfts.   Instructions for fatty liver: Recommend 1-2# weight loss per week until ideal body weight through exercise & diet. Low fat/cholesterol diet.   Avoid sweets, sodas, fruit juices, sweetened beverages like tea, etc. Gradually increase exercise from 15 min daily up to 1 hr per day 5 days/week. Limit alcohol use.

## 2023-09-16 NOTE — Telephone Encounter (Signed)
 Pt was made aware and verbalized understanding. Pt will call back to schedule appt.

## 2023-09-16 NOTE — Telephone Encounter (Signed)
 Lmom for pt to return call

## 2023-10-01 ENCOUNTER — Ambulatory Visit (INDEPENDENT_AMBULATORY_CARE_PROVIDER_SITE_OTHER): Payer: Self-pay | Admitting: Family

## 2023-10-01 VITALS — BP 126/69 | HR 74 | Temp 98.8°F | Ht 59.0 in

## 2023-10-01 DIAGNOSIS — I10 Essential (primary) hypertension: Secondary | ICD-10-CM

## 2023-10-01 DIAGNOSIS — N83209 Unspecified ovarian cyst, unspecified side: Secondary | ICD-10-CM

## 2023-10-01 MED ORDER — LISINOPRIL 10 MG PO TABS: 10.0000 mg | ORAL_TABLET | Freq: Every day | ORAL | 0 refills | Status: AC

## 2023-10-01 NOTE — Progress Notes (Unsigned)
Patient ID: Christine Watson, female    DOB: 1971/03/14  MRN: [id number]  CC: Medical Management of Chronic Issues   Subjective: Christine Watson is a 53 y.o. female who presents for Her concerns today include: ***  Patient Active Problem List   Diagnosis Date Noted   LUQ pain 07/12/2023   Elevated LFTs 07/12/2023   Encounter for screening colonoscopy 07/12/2023   BV (bacterial vaginosis) 10/10/2022   Vitamin D deficiency 01/12/2021     Current Outpatient Medications on File Prior to Visit  Medication Sig Dispense Refill   Ascorbic Acid (VITAMIN C) 1000 MG tablet Take 1,000 mg by mouth daily.     ASHWAGANDHA GUMMIES PO Take by mouth.     Magnesium 250 MG TABS Take 250 mg by mouth daily.     Omega 3-6-9 Fatty Acids (TRIPLE OMEGA-3-6-9 PO) Take by mouth.     Zinc 50 MG TABS Take 50 mg by mouth daily.     No current facility-administered medications on file prior to visit.    No Known Allergies  Social History   Socioeconomic History   Marital status: Divorced    Spouse name: Not on file   Number of children: Not on file   Years of education: Not on file   Highest education level: GED or equivalent  Occupational History   Not on file  Tobacco Use   Smoking status: Never   Smokeless tobacco: Never  Vaping Use   Vaping status: Never Used  Substance and Sexual Activity   Alcohol use: Not Currently   Drug use: Not Currently   Sexual activity: Not Currently    Birth control/protection: Surgical    Comment: tubal ligation  Other Topics Concern   Not on file  Social History Narrative   Not on file   Social Drivers of Health   Financial Resource Strain: Low Risk  (10/01/2023)   Overall Financial Resource Strain (CARDIA)    Difficulty of Paying Living Expenses: Not very hard  Food Insecurity: No Food Insecurity (10/01/2023)   Hunger Vital Sign    Worried About Running Out of Food in the Last Year: Never true    Ran Out of Food in the Last Year: Never true   Transportation Needs: No Transportation Needs (10/01/2023)   PRAPARE - Administrator, Civil Service (Medical): No    Lack of Transportation (Non-Medical): No  Physical Activity: Sufficiently Active (10/01/2023)   Exercise Vital Sign    Days of Exercise per Week: 4 days    Minutes of Exercise per Session: 150+ min  Stress: No Stress Concern Present (10/01/2023)   [employer] of Occupational Health - Occupational Stress Questionnaire    Feeling of Stress : Not at all  Social Connections: Moderately Isolated (10/01/2023)   Social Connection and Isolation Panel [NHANES]    Frequency of Communication with Friends and Family: More than three times a week    Frequency of Social Gatherings with Friends and Family: Twice a week    Attends Religious Services: More than 4 times per year    Active Member of [employer] or Organizations: No    Attends Engineer, structural: Not on file    Marital Status: Divorced  Catering manager Violence: Not on file    Family History  Problem Relation Age of Onset   Hypertension Mother    Colon cancer Neg Hx     Past Surgical History:  Procedure Laterality Date   APPENDECTOMY N/A  [facility] 10/08/2020   CHOLECYSTECTOMY N/A    [facility] 10/08/2020   COLONOSCOPY WITH PROPOFOL N/A 07/31/2023   Procedure: COLONOSCOPY WITH PROPOFOL;  Surgeon: Corbin Ade, MD;  Location: [facility];  Service: Endoscopy;  Laterality: N/A;  12:45 pm, asa 3   LEEP  03/2020   POLYPECTOMY  07/31/2023   Procedure: POLYPECTOMY;  Surgeon: Corbin Ade, MD;  Location: [facility];  Service: Endoscopy;;   TUBAL LIGATION N/A    [facility] 10/08/2020    ROS: Review of Systems Negative except as stated above  PHYSICAL EXAM: BP 126/69   Pulse 74   Temp 98.8 F (37.1 C) (Oral)   Ht 4\' 11"  (1.499 m)   SpO2 95%   BMI 41.69 kg/m   Physical Exam  {female adult master:310786} {female adult master:310785}     Latest Ref Rng & Units 07/12/2023     9:33 AM 07/01/2023    3:51 PM 10/08/2022    4:05 PM  CMP  Glucose 70 - 99 mg/dL  83  86   BUN 6 - 24 mg/dL  13  15   Creatinine 7.82 - 1.00 mg/dL  9.56  2.13   Sodium 086 - 144 mmol/L  142  139   Potassium 3.5 - 5.2 mmol/L  4.5  4.2   Chloride 96 - 106 mmol/L  104  102   CO2 20 - 29 mmol/L  24  21   Calcium 8.7 - 10.2 mg/dL  9.7  9.9   Total Protein 6.0 - 8.5 g/dL 8.0  7.1  7.5   Total Bilirubin 0.0 - 1.2 mg/dL 0.4  0.3  0.4   Alkaline Phos 44 - 121 IU/L 95  67  71   AST 0 - 40 IU/L 39  43  17   ALT 0 - 32 IU/L 55  56  21    Lipid Panel     Component Value Date/Time   CHOL 201 (H) 01/11/2021 1647   TRIG 119 01/11/2021 1647   HDL 53 01/11/2021 1647   CHOLHDL 3.8 01/11/2021 1647   LDLCALC 127 (H) 01/11/2021 1647    CBC    Component Value Date/Time   WBC 7.3 07/01/2023 1551   WBC 7.5 12/09/2020 1025   RBC 4.19 07/01/2023 1551   RBC 4.72 12/09/2020 1025   HGB 12.4 07/01/2023 1551   HCT 38.1 07/01/2023 1551   PLT 319 07/01/2023 1551   MCV 91 07/01/2023 1551   MCH 29.6 07/01/2023 1551   MCH 30.5 12/09/2020 1025   MCHC 32.5 07/01/2023 1551   MCHC 33.0 12/09/2020 1025   RDW 13.3 07/01/2023 1551   LYMPHSABS 2.7 12/09/2020 1025   MONOABS 0.6 12/09/2020 1025   EOSABS 0.1 12/09/2020 1025   BASOSABS 0.1 12/09/2020 1025    ASSESSMENT AND PLAN:  1. Primary hypertension *** - lisinopril (ZESTRIL) 10 MG tablet; Take 1 tablet (10 mg total) by mouth daily.  Dispense: 90 tablet; Refill: 0    Patient was given the opportunity to ask questions.  Patient verbalized understanding of the plan and was able to repeat key elements of the plan. Patient was given clear instructions to go to Emergency Department or return to medical center if symptoms don't improve, worsen, or new problems develop.The patient verbalized understanding.   No orders of the defined types were placed in this encounter.    Requested Prescriptions   Signed Prescriptions Disp Refills   lisinopril  (ZESTRIL) 10 MG tablet 90 tablet 0  Sig: Take 1 tablet (10 mg total) by mouth daily.    No follow-ups on file.  Rema Fendt, NP

## 2023-10-01 NOTE — Progress Notes (Unsigned)
Patient wants to talk about a cyst that was found.

## 2023-12-31 ENCOUNTER — Ambulatory Visit: Payer: Self-pay | Admitting: Family
